# Patient Record
Sex: Female | Born: 1985 | Race: White | Hispanic: No | Marital: Married | Smoking: Never smoker
Health system: Southern US, Community
[De-identification: ages and names within clinical notes are randomized; demographics above are authoritative.]

## PROBLEM LIST (undated history)

## (undated) ENCOUNTER — Inpatient Hospital Stay (HOSPITAL_COMMUNITY): Payer: Self-pay

## (undated) DIAGNOSIS — K589 Irritable bowel syndrome without diarrhea: Secondary | ICD-10-CM

## (undated) HISTORY — PX: COLON BIOPSY: SHX1369

## (undated) HISTORY — PX: WISDOM TOOTH EXTRACTION: SHX21

## (undated) HISTORY — DX: Irritable bowel syndrome, unspecified: K58.9

## (undated) HISTORY — PX: OTHER SURGICAL HISTORY: SHX169

## (undated) HISTORY — PX: COLON SURGERY: SHX602

---

## 2010-03-16 ENCOUNTER — Emergency Department (HOSPITAL_COMMUNITY): Admission: EM | Admit: 2010-03-16 | Discharge: 2010-03-16 | Payer: Self-pay | Admitting: Emergency Medicine

## 2010-12-05 HISTORY — PX: OTHER SURGICAL HISTORY: SHX169

## 2011-02-23 LAB — POCT I-STAT, CHEM 8
Calcium, Ion: 1.11 mmol/L — ABNORMAL LOW (ref 1.12–1.32)
Creatinine, Ser: 0.8 mg/dL (ref 0.4–1.2)
Hemoglobin: 13.6 g/dL (ref 12.0–15.0)
TCO2: 25 mmol/L (ref 0–100)

## 2011-02-23 LAB — URINALYSIS, ROUTINE W REFLEX MICROSCOPIC
Bilirubin Urine: NEGATIVE
Ketones, ur: NEGATIVE mg/dL
Nitrite: NEGATIVE
Protein, ur: NEGATIVE mg/dL
Urobilinogen, UA: 0.2 mg/dL (ref 0.0–1.0)
pH: 5.5 (ref 5.0–8.0)

## 2011-02-23 LAB — DIFFERENTIAL
Basophils Absolute: 0 10*3/uL (ref 0.0–0.1)
Basophils Relative: 0 % (ref 0–1)
Monocytes Relative: 5 % (ref 3–12)

## 2011-02-23 LAB — URINE MICROSCOPIC-ADD ON

## 2011-02-23 LAB — CBC
HCT: 38.4 % (ref 36.0–46.0)
Hemoglobin: 13.3 g/dL (ref 12.0–15.0)
MCHC: 34.6 g/dL (ref 30.0–36.0)
MCV: 88.6 fL (ref 78.0–100.0)
WBC: 6.9 10*3/uL (ref 4.0–10.5)

## 2011-04-06 ENCOUNTER — Other Ambulatory Visit: Payer: Self-pay | Admitting: General Surgery

## 2011-04-08 ENCOUNTER — Ambulatory Visit
Admission: RE | Admit: 2011-04-08 | Discharge: 2011-04-08 | Disposition: A | Discharge status: Home / Self Care | Payer: BC Managed Care – PPO | Source: Ambulatory Visit | Attending: General Surgery | Admitting: General Surgery

## 2011-04-08 MED ORDER — IOHEXOL 300 MG/ML  SOLN
100.0000 mL | Freq: Once | INTRAMUSCULAR | Status: AC | PRN
  Administered 2011-04-08: 100 mL via INTRAVENOUS

## 2011-04-27 ENCOUNTER — Encounter (HOSPITAL_COMMUNITY)
Admission: RE | Admit: 2011-04-27 | Discharge: 2011-04-27 | Disposition: A | Discharge status: Home / Self Care | Payer: BC Managed Care – PPO | Source: Ambulatory Visit | Attending: General Surgery | Admitting: General Surgery

## 2011-04-27 LAB — CBC
MCHC: 34.1 g/dL (ref 30.0–36.0)
RDW: 11.5 % (ref 11.5–15.5)

## 2011-04-27 LAB — DIFFERENTIAL
Basophils Absolute: 0 10*3/uL (ref 0.0–0.1)
Basophils Relative: 0 % (ref 0–1)
Eosinophils Absolute: 0.3 10*3/uL (ref 0.0–0.7)
Monocytes Absolute: 0.4 10*3/uL (ref 0.1–1.0)
Monocytes Relative: 6 % (ref 3–12)
Neutro Abs: 3.4 10*3/uL (ref 1.7–7.7)
Neutrophils Relative %: 56 % (ref 43–77)

## 2011-04-27 LAB — COMPREHENSIVE METABOLIC PANEL
BUN: 4 mg/dL — ABNORMAL LOW (ref 6–23)
CO2: 25 mEq/L (ref 19–32)
Calcium: 9.7 mg/dL (ref 8.4–10.5)
Chloride: 103 mEq/L (ref 96–112)
Creatinine, Ser: 0.64 mg/dL (ref 0.4–1.2)
Glucose, Bld: 88 mg/dL (ref 70–99)
Potassium: 4.2 mEq/L (ref 3.5–5.1)
Total Bilirubin: 0.4 mg/dL (ref 0.3–1.2)
Total Protein: 6.9 g/dL (ref 6.0–8.3)

## 2011-04-27 LAB — URINALYSIS, ROUTINE W REFLEX MICROSCOPIC
Bilirubin Urine: NEGATIVE
Glucose, UA: NEGATIVE mg/dL
Hgb urine dipstick: NEGATIVE
Ketones, ur: NEGATIVE mg/dL
Nitrite: NEGATIVE
Specific Gravity, Urine: 1.016 (ref 1.005–1.030)
pH: 6 (ref 5.0–8.0)

## 2011-04-27 LAB — SURGICAL PCR SCREEN: Staphylococcus aureus: NEGATIVE

## 2011-04-27 LAB — URINE MICROSCOPIC-ADD ON

## 2011-05-04 ENCOUNTER — Inpatient Hospital Stay (HOSPITAL_COMMUNITY)
Admission: RE | Admit: 2011-05-04 | Discharge: 2011-05-12 | DRG: 149 | Disposition: A | Discharge status: Home Health | Payer: BC Managed Care – PPO | Source: Ambulatory Visit | Attending: General Surgery | Admitting: General Surgery

## 2011-05-04 ENCOUNTER — Other Ambulatory Visit (INDEPENDENT_AMBULATORY_CARE_PROVIDER_SITE_OTHER): Payer: Self-pay | Admitting: General Surgery

## 2011-05-04 DIAGNOSIS — K929 Disease of digestive system, unspecified: Principal | ICD-10-CM | POA: Diagnosis present

## 2011-05-04 DIAGNOSIS — R339 Retention of urine, unspecified: Secondary | ICD-10-CM | POA: Diagnosis not present

## 2011-05-04 DIAGNOSIS — IMO0002 Reserved for concepts with insufficient information to code with codable children: Secondary | ICD-10-CM | POA: Diagnosis not present

## 2011-05-04 DIAGNOSIS — Z01812 Encounter for preprocedural laboratory examination: Secondary | ICD-10-CM

## 2011-05-04 DIAGNOSIS — F411 Generalized anxiety disorder: Secondary | ICD-10-CM | POA: Diagnosis present

## 2011-05-04 DIAGNOSIS — Y832 Surgical operation with anastomosis, bypass or graft as the cause of abnormal reaction of the patient, or of later complication, without mention of misadventure at the time of the procedure: Secondary | ICD-10-CM | POA: Diagnosis present

## 2011-05-04 DIAGNOSIS — R7989 Other specified abnormal findings of blood chemistry: Secondary | ICD-10-CM | POA: Diagnosis not present

## 2011-05-04 LAB — TYPE AND SCREEN: Antibody Screen: NEGATIVE

## 2011-05-04 LAB — ABO/RH: ABO/RH(D): O POS

## 2011-05-05 LAB — CBC
HCT: 37.6 % (ref 36.0–46.0)
Hemoglobin: 13 g/dL (ref 12.0–15.0)
MCH: 29.7 pg (ref 26.0–34.0)
MCHC: 34.6 g/dL (ref 30.0–36.0)
MCV: 85.8 fL (ref 78.0–100.0)
Platelets: 164 K/uL (ref 150–400)
RBC: 4.38 MIL/uL (ref 3.87–5.11)
RDW: 11.4 % — ABNORMAL LOW (ref 11.5–15.5)
WBC: 9 K/uL (ref 4.0–10.5)

## 2011-05-05 LAB — BASIC METABOLIC PANEL WITH GFR
BUN: <3 mg/dL — ABNORMAL LOW (ref 6–23)
CO2: 26 meq/L (ref 19–32)
Calcium: 8.4 mg/dL (ref 8.4–10.5)
Chloride: 103 meq/L (ref 96–112)
Creatinine, Ser: <0.47 mg/dL (ref 0.4–1.2)
Glucose, Bld: 129 mg/dL — ABNORMAL HIGH (ref 70–99)
Potassium: 3.5 meq/L (ref 3.5–5.1)
Sodium: 135 meq/L (ref 135–145)

## 2011-05-09 LAB — COMPREHENSIVE METABOLIC PANEL
BUN: <3 mg/dL — ABNORMAL LOW (ref 6–23)
Calcium: 9.1 mg/dL (ref 8.4–10.5)
Chloride: 102 mEq/L (ref 96–112)
Creatinine, Ser: <0.47 mg/dL (ref 0.4–1.2)
Glucose, Bld: 113 mg/dL — ABNORMAL HIGH (ref 70–99)
Total Protein: 6.3 g/dL (ref 6.0–8.3)

## 2011-05-09 LAB — CBC
HCT: 35.9 % — ABNORMAL LOW (ref 36.0–46.0)
MCHC: 33.4 g/dL (ref 30.0–36.0)
MCV: 86.5 fL (ref 78.0–100.0)
RBC: 4.15 MIL/uL (ref 3.87–5.11)
RDW: 11.5 % (ref 11.5–15.5)
WBC: 5.1 10*3/uL (ref 4.0–10.5)

## 2011-05-10 ENCOUNTER — Inpatient Hospital Stay (HOSPITAL_COMMUNITY): Payer: BC Managed Care – PPO

## 2011-05-10 LAB — COMPREHENSIVE METABOLIC PANEL
AST: 156 U/L — ABNORMAL HIGH (ref 0–37)
CO2: 25 mEq/L (ref 19–32)
Chloride: 104 mEq/L (ref 96–112)
Creatinine, Ser: 0.52 mg/dL (ref 0.4–1.2)
GFR calc Af Amer: >60 mL/min (ref >60)
GFR calc non Af Amer: >60 mL/min (ref >60)
Glucose, Bld: 112 mg/dL — ABNORMAL HIGH (ref 70–99)
Total Bilirubin: 0.6 mg/dL (ref 0.3–1.2)

## 2011-05-10 NOTE — Op Note (Signed)
NAMESANDRINE, BLOODSWORTH               ACCOUNT NO.:  [id number]  MEDICAL RECORD NO.:  [id number]           PATIENT TYPE:  I  LOCATION:  5155                         FACILITY:  [facility]  PHYSICIAN:  Angelia Mould. Derrell Lolling, M.D.DATE OF BIRTH:  Apr 09, 1986  DATE OF PROCEDURE:  05/04/2011 DATE OF DISCHARGE:                              OPERATIVE REPORT   PREOPERATIVE DIAGNOSES:  Anastomotic stricture, status post ileocolic resection for intestinal atresia.  POSTOPERATIVE DIAGNOSES:  Anastomotic stricture, status post ileocolic resection for intestinal atresia.  OPERATIONS PERFORMED:  Exploratory laparotomy, lysis of adhesions, resection of terminal ileum, and hepatic flexure of colon with primary anastomosis.  SURGEON:  Angelia Mould. Derrell Lolling, MD  FIRST ASSISTANT:  Abigail Miyamoto, MD  OPERATIVE INDICATIONS:  This is a 25 year old healthy Caucasian female who was born with intestinal atresia and was taken to the operating room as a newborn by Dr. Orpah Melter.  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 She recovered and has been healthy.  She has had chronic diarrhea.  For the past 4 months, she has been having decreased stool volume to where she is to have several stools a day; she now just has about one liquid stool a day.  She feels crampy and bloated in that her clothes do not fit right, and she feels like she has trapped a gas.  A colonoscopy was performed in April 2012, and he found two small inflammatory polyps in the rectum, and then he found a very tight stricture of the ileocolic anastomosis, and the scope could not be passed through that.  He biopsied the stricture, which was read as "ulcer."  There was no granulomas identified.  No evidence of Crohn's.  Her father has Crohn disease.   We did a CT enterography, and this shows a short segment, annular, ileocolic anastomosis on the right side of the abdomen with focal thickening consistent with an anastomotic stricture.  That looks like there may be a blind pouch of right colon  up onto the liver as well, which also fills with the contrast.  She was offered exploration and resection of her stricture for control of her obstructive symptoms. She has undergone a bowel prep at home and is brought to the operating room electively.  OPERATIVE FINDINGS:  There was a concentric focal thickening at the ileocolic anastomosis on the right side.  There was a side pouch of right colon, which probably was the cecum, which was never resected. There was no sign of any cancer.  This did not look like Crohn's disease. We ran the intestinal tract from the ligament of Treitz all the way down to the rectum on about three occasions and found no other pathologic abnormalities, no masses, no strictures, no mesenteric thickening.  The stomach looked normal other than adhesions to the left upper quadrantwhere her gastrostomy tube was.  The liver and gallbladder looked normal.  The uterus and ovaries looked and felt normal.  OPERATIVE TECHNIQUE:  Following the induction of general endotracheal anesthesia, a Foley catheter was placed.  Intravenous antibiotics were given.  The abdomen was prepped and draped in a sterile fashion. Surgical time-out was held identifying correct patient and correct procedure.  A midline laparotomy incision was made slightly above and slightly below the umbilicus.  The fascia was incised in the midline and the abdominal cavity entered under direct vision.  There were minimal adhesions to the anterior abdominal wall.  We ran the small bowel and colon and identified all of the abnormalities.  I then mobilized the ileocolic anastomosis and the remaining right colon and the hepatic flexure all the way up around to the right  transverse colon.  I identified the duodenum and that was left uninjured.  We divided the gastrocolic omentum as part of this mobilization.  We decided to go ahead and resect this area conservatively.  We transected the distal ileum with a GIA stapling device, and we probably sacrificed about 2 or 3 inches of terminal ileum.  We took down the mesentery of the terminal ileum and the right colon and hepatic flexure.  Some of the larger vessels were clamped, divided, and ligated with a 2-0 silk ties and some of the smaller ones were controlled with the LigaSure device.  The right transverse colon was transected with GIA stapling device about 4 inches distal to the old anastomosis.  The specimen was sent to the lab with the appropriate history attached.  We irrigated the abdomen and pelvis extensively.  There was no bleeding.  We then created an anastomosis between the terminal ileum at the right transverse colon with a GIA stapling device.  The common defect in the bowel wall was inspected, found to be hemostatic, and then closed with a TA-60 stapling device. We placed further silk sutures to reinforce the staple line at critical points.  We then changed our instruments and gloves.  We irrigated the abdomen and pelvis extensively.  There was no more bleeding from any of the staple lines or suture lines.  The mesentery was closed with interrupted sutures of 3-0 silk.  Everything looked good.  The anastomosis was checked and tested one more time.  The bowel was viable and had a good blood supply.  We returned the small bowel and colon and omentum to their anatomic positions.  The midline fascia was closed with a running suture of #1 double-stranded PDS.  The skin was closed with skin staples.  Clean bandages were placed, and the patient was taken to recovery room in stable condition.  Estimated blood loss was about 50-75 mL.  Complications none.  Sponge, needle, and instrument counts  were correct.     Angelia Mould. Derrell Lolling, M.D.     HMI/MEDQ  D:  05/04/2011  T:  05/05/2011  Job:  [id number]  cc:   Fayrene Fearing L. Malon Kindle., M.D. Carola J. Gerri Spore, M.D.  Electronically Signed by Claud Kelp M.D. on 05/10/2011 09:11:05 AM

## 2011-05-11 LAB — HEPATIC FUNCTION PANEL
Alkaline Phosphatase: 65 U/L (ref 39–117)
Indirect Bilirubin: 0.2 mg/dL — ABNORMAL LOW (ref 0.3–0.9)
Total Bilirubin: 0.3 mg/dL (ref 0.3–1.2)
Total Protein: 6 g/dL (ref 6.0–8.3)

## 2011-05-23 ENCOUNTER — Encounter (INDEPENDENT_AMBULATORY_CARE_PROVIDER_SITE_OTHER): Payer: Self-pay | Admitting: General Surgery

## 2011-05-23 NOTE — Discharge Summary (Signed)
NAMECARLON, DAVIDSON NO.:  [id number]  MEDICAL RECORD NO.:  [id number]  LOCATION:  5155                         FACILITY:  [facility]  PHYSICIAN:  Angelia Mould. Derrell Lolling, M.D.DATE OF BIRTH:  10-20-1986  DATE OF ADMISSION:  05/04/2011 DATE OF DISCHARGE:  05/12/2011                              DISCHARGE SUMMARY   FINAL DIAGNOSES: 1. Ileocolic anastomotic stricture with partial obstruction. 2. Remote history of ileocolic resection and anastomosis for     intestinal atresia as newborn.  OPERATIONS PERFORMED:  Exploratory laparotomy, resection of terminal ileum and hepatic flexure of colon with primary anastomosis.  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  The operative note describes a malrotation and trimming of both ends of the intestine and anastomosis between the terminal ileum and right colon.  She has been healthy since that time, although has had chronic diarrhea.  She has been followed by Dr. Carman Ching.  Her father has Crohn's disease.  She has developed crampy abdominal pain and reduced stool volume, although really has not been vomiting or lost any weight.  A colonoscopy was performed showing a very tight stricture of the ileocolic anastomosis, through which the scope could not be passed.  Biopsy of the stricture was read as benign ulcer. No granulomas or Crohn disease was identified.  CT enterography also showed a short segment of concentric thickening at the present anastomosis.  It was felt the patient was developing obstruction from a stricture at the anastomosis and was offered resection.  Her diarrhea had been suspected as being due to some superimposed irritable bowel syndrome by her gastroenterologist.  HOSPITAL COURSE:  On day of admission, the patient was taken to operating room, underwent laparotomy.  We found a concentric  focal thickening of the ileocolic anastomosis on the right side as well as side pouch of right colon which was probably the cecum which was never resected.  All this area was resected conservatively and stapled anastomosis between the distal ileum and the hepatic flexure was performed and that went well.  The final pathology report showed ulcerated and inflamed enteric mucosa with reactive epithelial changes consistent with an anastomotic site. There was no evidence of malignancy or Crohn disease.  Postoperatively, the patient did fairly well.  She was slow to resolve her ileus, but ultimately it did resolve.  She had some anxiety and nightmares in the hospital which resolved with adjusting her pain medication.  We discussed her pathology report with her.  We slowly tapered her off of narcotics.  She had an episode of nausea and vomiting on June 4 and we chose to get a gallbladder ultrasound which was normal and abdominal x-rays which were abnormal but liver function tests were performed which showed some elevation of SGOT and SGPT.  This had been present on admission but was actually a little bit more elevated at this point in time.  We asked Dr. Dorena Cookey to see her.  He felt that the liver function test could be followed up as an outpatient but the etiology was not entirely clear.  She improved  and we were able to advance her to a solid diet.  She began having bowel movements.  Her abdominal wounds were healing uneventfully.  She was discharged on May 12, 2011.  Final liver function tests showed an SGOT of 162 and SGPT of 208.  The rest of the liver function tests were normal.  She was instructed in diet and activities and was told to return to see me in office in 3-4 weeks.  She was told to follow up with the [facility] as an outpatient to test her liver function test further in the future.  Serologic evaluation was felt to be warranted at siome point unless the liver function  tests normalized.  DISCHARGE MEDICATIONS: 1. Xanax 1 mg 3 times a day as needed. 2. Multivitamins. 3. She also takes Junel Fe.     Angelia Mould. Derrell Lolling, M.D.     HMI/MEDQ  D:  05/22/2011  T:  05/22/2011  Job:  [id number]  cc:   Fayrene Fearing L. Malon Kindle., M.D. Carola J. Gerri Spore, M.D.  Electronically Signed by Claud Kelp M.D. on 05/23/2011 05:18:29 PM

## 2011-06-21 ENCOUNTER — Encounter (INDEPENDENT_AMBULATORY_CARE_PROVIDER_SITE_OTHER): Payer: Self-pay | Admitting: General Surgery

## 2011-06-21 ENCOUNTER — Ambulatory Visit (INDEPENDENT_AMBULATORY_CARE_PROVIDER_SITE_OTHER): Payer: BC Managed Care – PPO | Admitting: General Surgery

## 2011-06-21 DIAGNOSIS — Z9889 Other specified postprocedural states: Secondary | ICD-10-CM

## 2011-06-21 NOTE — Patient Instructions (Signed)
You are doing well, and you may resume all normal physical activities without restriction. You may return to work.  In my opinion, it is safe to get pregnant. I would seek the advice of a obstetrician before getting pregnant, however.  Remember that you have lost your terminal ileum and so there may be some problems with vitamin deficiency and anemia in the future. Be sure that your other physicians are aware of this.  In terms of your wound, simply packed it loosely with 1/4 inch gauze twice a day for about one week. After that time simply cover it with a Band-Aid. I expect it will heal completely in 3-4 weeks.  Return to see me p.r.n.

## 2011-06-21 NOTE — Progress Notes (Signed)
Subjective:     Patient ID: Abigail Park, female   DOB: Dec 26, 1985, 25 y.o.   MRN: [id number]  HPI The patient is doing well. She has no pain. She feels ready to return to work. Her appetite is much better. She's having one or 2 bowel movements per day, sometimes loose and sometimes soft. Her wound is healing and rapidly.Review of Systems Past Medical History  Diagnosis Date  . Allergy   . IBS (irritable bowel syndrome)   . Ulcer   . Chronic kidney disease     kidney stones   Current outpatient prescriptions:Multiple Vitamin (MULTIVITAMIN) capsule, Take 1 capsule by mouth daily.  , Disp: , Rfl: ;  norethindrone-ethinyl estradiol (JUNEL FE 1/20) 1-20 MG-MCG per tablet, Take 1 tablet by mouth daily.  , Disp: , Rfl: ;  ALPRAZolam (XANAX) 1 MG tablet, Take 1 mg by mouth as needed.  , Disp: , Rfl:  Allergies  Allergen Reactions  . Motrin (Ibuprofen) Hives   History reviewed. No pertinent family history. History  Substance Use Topics  . Smoking status: Never Smoker   . Smokeless tobacco: Never Used  . Alcohol Use: Yes      Objective:   Physical Exam The patient looks well. Her mother is with her throughout the encounter.  Abdomen is soft flat and nontender. There is a small open area in the midportion of the wound, no more than 1 cm in size. There is healthy granulation tissue in the wound. It was repacked loosely with iodoform gauze.    Assessment:     Status post exploratory laparotomy and resection of ileocolic anastomotic stricture. Recovering uneventfully.  Minor wound infection, healing rapidly by secondary intention.  History of intestinal atresia of the distal ileum at birth with perforation and peritonitis, status post resection and ileocolic anastomosis by Dr. Orpah Melter.    Plan:     Wound care is discussed. Continue packing b.i.d. For one week. Thereafter simply cover with band aid nd expect complete healing in 3-4 weeks.  Okay to resume all normal activities  without restriction.  See me PRN.

## 2011-06-22 NOTE — Consult Note (Signed)
  NAME:  CLARITY, CISZEK               ACCOUNT NO.:  [id number]  MEDICAL RECORD NO.:  [id number]  LOCATION:  SDS                          FACILITY:  [facility]  PHYSICIAN:  Syncere Eble C. Madilyn Fireman, M.D.    DATE OF BIRTH:  [birth year]  DATE OF CONSULTATION: DATE OF DISCHARGE:  04/27/2011                                CONSULTATION   REASON FOR CONSULTATION:  Elevated liver function tests and abdominal pain, nausea, and vomiting.  HISTORY OF PRESENT ILLNESS:  The patient is a 25 year old white female born with intestinal atresia who underwent surgical resection at birth with enterocolic anastomosis.  This has become stenotic and inflamed causing obstructive symptoms.  She underwent a resection of it on May 31.  Since then she began having some nausea and vomiting that she relates to having been switched from IV pain medicine to oral Vicodin. This has caused nausea and vomiting and she is also complained of generalized abdominal pain.  She also was noted to have elevated liver function tests with ALT of 88 on May 23, 93 on June 4, and 180 on June 5, with an AST of 156.  She has had no known liver problems.  An abdominal ultrasound was obtained today which showed no hepatobiliary abnormalities, except for some echogenic sludge.  Plain abdominal films today showed nonspecific bowel gas pattern.  The patient does complain of big anxiety component to her symptoms.  PAST MEDICAL HISTORY:  Essentially unremarkable other than the above except for generalized anxiety.  MEDICINES:  Currently 1. Protonix, p.r.n. 2. Toradol. 3. Ativan 0.25 mg IV p.r.n. 4. Zofran just added for pain.  ALLERGIES:  IBUPROFEN.  FAMILY HISTORY:  Father has Crohn disease.  PHYSICAL EXAM:  GENERAL:  Well-developed, well-nourished white female in no acute distress. HEART:  Regular rate and rhythm without murmurs. LUNGS:  Clear. ABDOMEN:  Soft, nondistended with normoactive bowel sounds.  Tenderness is expected near the suture  line.  IMPRESSION:  Abdominal pain, nausea and vomiting after abdominal surgery, unclear whether this is simply from an ileus or related to narcotic analgesics for her postoperative pain and there is probably an anxiety component.  PLAN:  Incurs retake Zofran she has not been asking for nausea medicine. I will also increase her Ativan dose slightly to take p.r.n. for anxiety and simply advance diet as tolerated, observe her symptoms going forward.  We will recheck liver function tests tomorrow and will probably need some sort of serologic workup for her elevations unless they resolve spontaneously.          ______________________________ Everardo All Madilyn Fireman, M.D.     JCH/MEDQ  D:  05/10/2011  T:  05/10/2011  Job:  [id number]  cc:   Angelia Mould. Derrell Lolling, M.D.  Electronically Signed by Dorena Cookey M.D. on 06/22/2011 06:51:23 PM

## 2011-09-14 ENCOUNTER — Other Ambulatory Visit (HOSPITAL_COMMUNITY)
Admission: RE | Admit: 2011-09-14 | Discharge: 2011-09-14 | Disposition: A | Discharge status: Home / Self Care | Payer: BC Managed Care – PPO | Source: Ambulatory Visit | Attending: Family Medicine | Admitting: Family Medicine

## 2011-09-14 ENCOUNTER — Other Ambulatory Visit: Payer: Self-pay | Admitting: Family Medicine

## 2011-09-14 DIAGNOSIS — Z124 Encounter for screening for malignant neoplasm of cervix: Secondary | ICD-10-CM | POA: Insufficient documentation

## 2012-09-07 IMAGING — CT CT ENTEROGRAPHY (ABD-PELV W/ CM)
2 of 5 series · 12 of 36 positions shown, 19 images · IV contrast (VOLUMEN & [ID] OMNI 300)
Comparison: [HOSPITAL] CT urogram 03/16/2010.

CLINICAL DATA: Incomplete colonoscopy due to atresia.  Prior colon
surgery as infant.  Diarrhea, melena, history renal calculi.

CT ABDOMEN AND PELVIS WITH CONTRAST (CT ENTEROGRAPHY)
TECHNIQUE: Multidetector CT of the abdomen and pelvis during bolus
administration of intravenous contrast. Negative oral contrast
VoLumen was given.
Contrast: 100 ml Lmnipaque-ZKK intravenously and 3596 ml VoLumen
orally.

[Series 102: enterography (id) · axial · 0.70mm/px · z∈[-302,+38]mm · 11 of 164 slices shown, 17 images]
[im 14/164  soft-tissue]
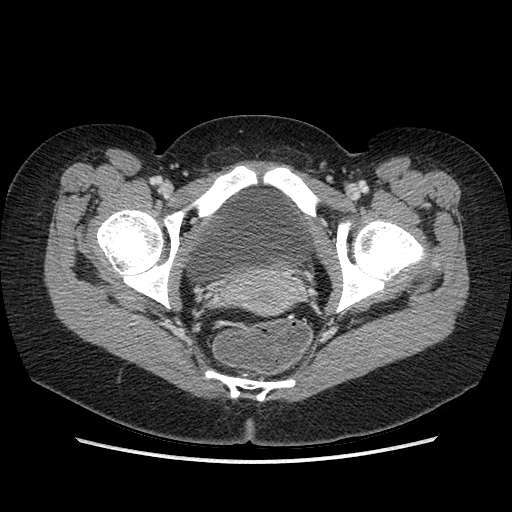
[im 14/164  bone]
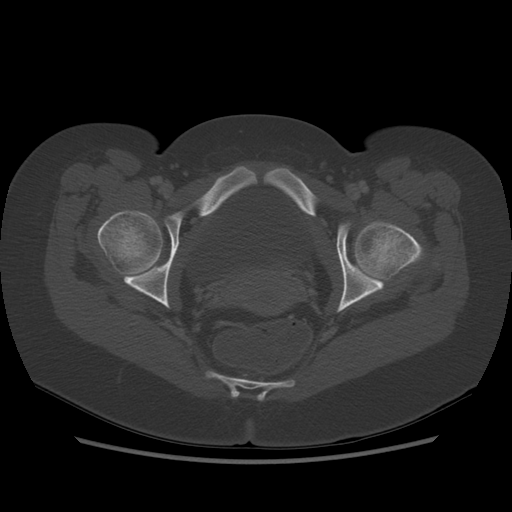
[im 28/164  soft-tissue]
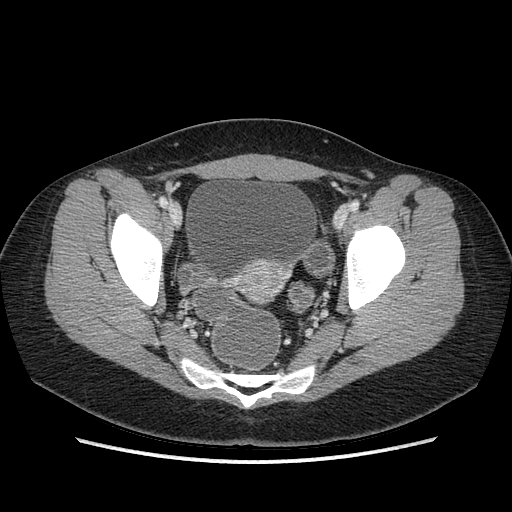
[im 41/164  soft-tissue]
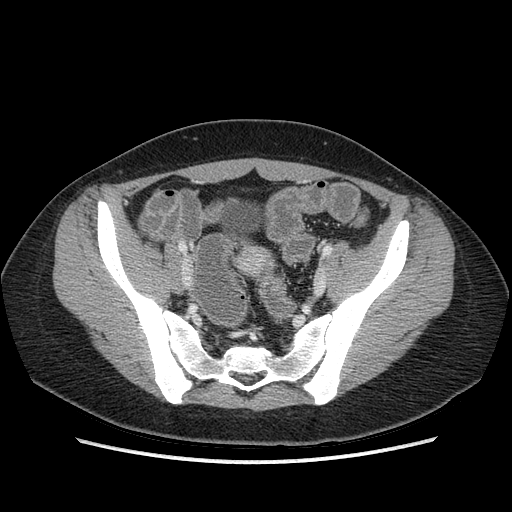
[im 55/164  soft-tissue]
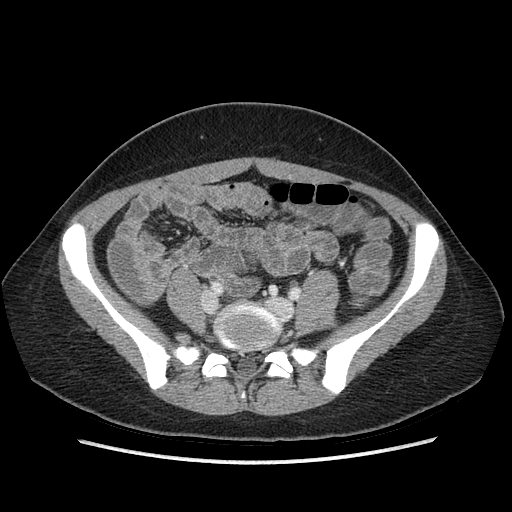
[im 68/164  soft-tissue]
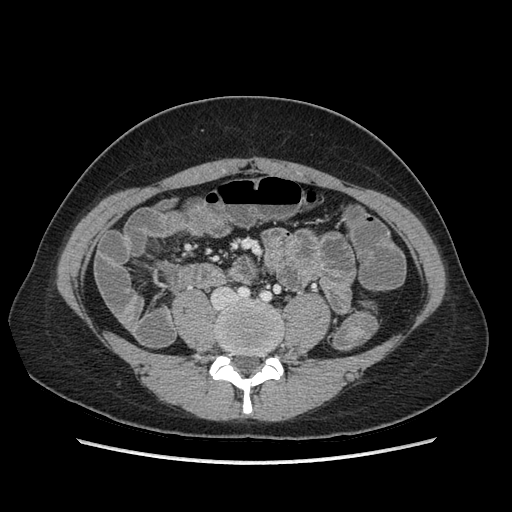
[im 82/164  soft-tissue]
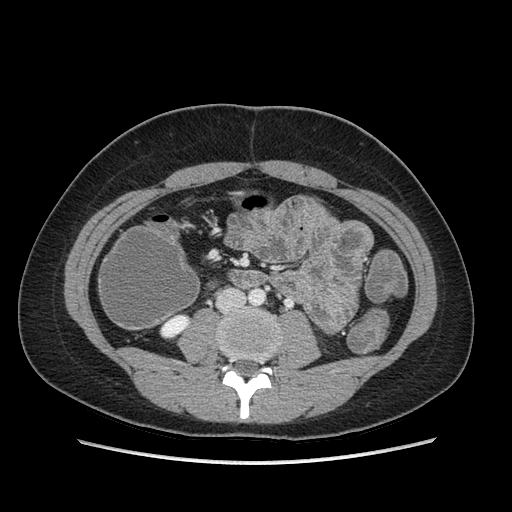
[im 96/164  soft-tissue]
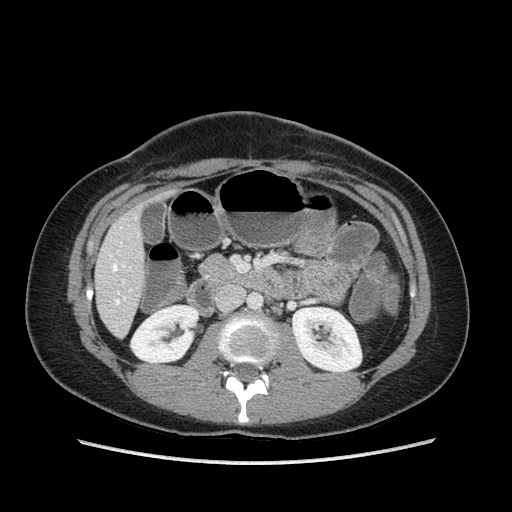
[im 109/164  soft-tissue]
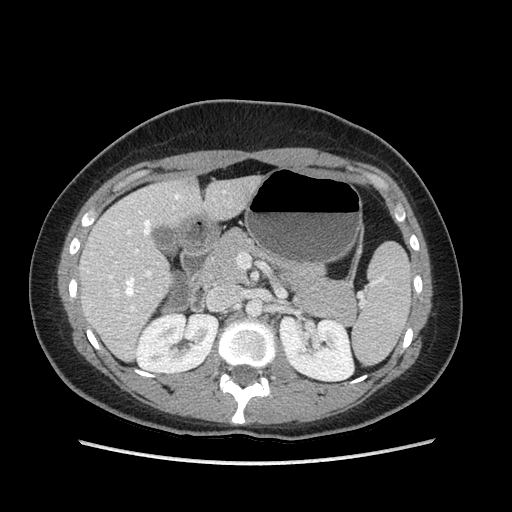
[im 109/164  lung]
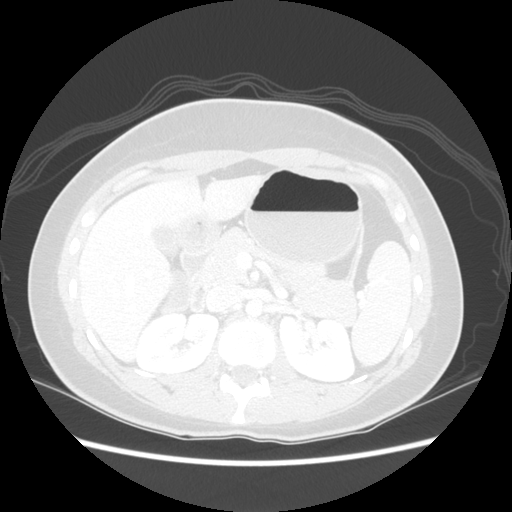
[im 123/164  soft-tissue]
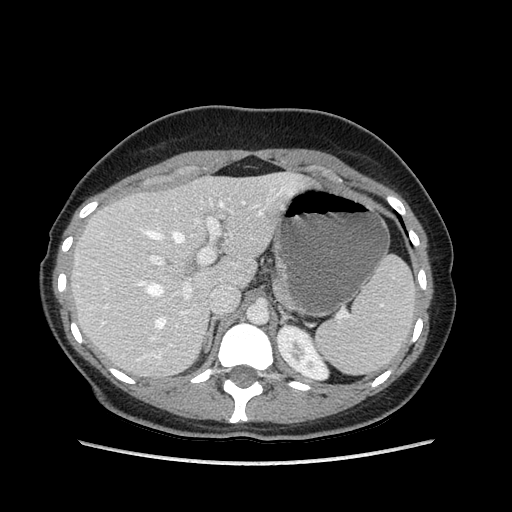
[im 123/164  lung]
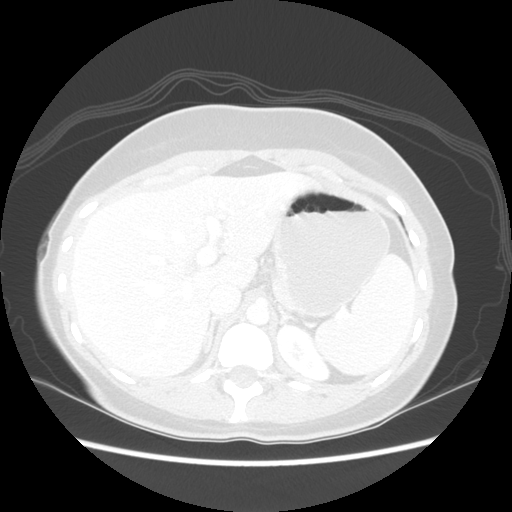
[im 123/164  bone]
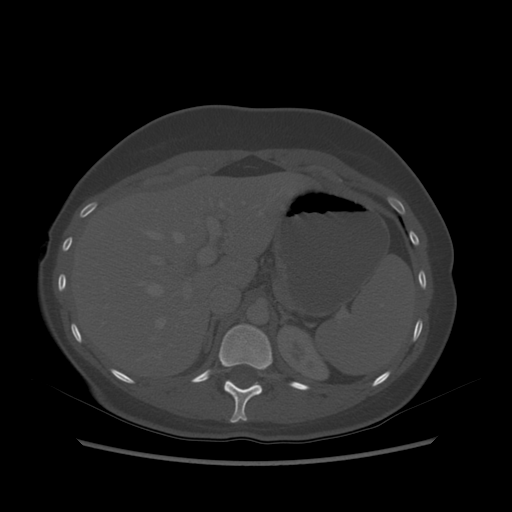
[im 136/164  soft-tissue]
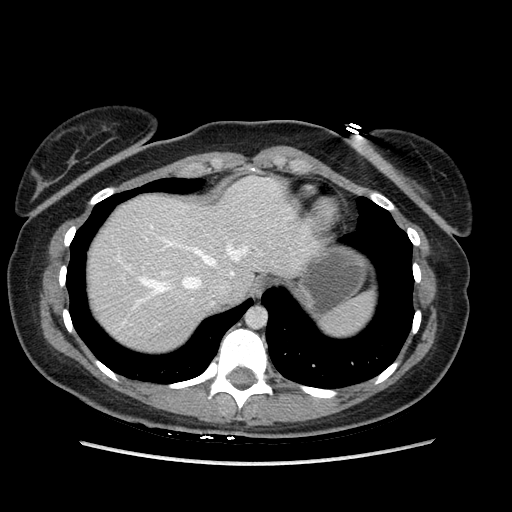
[im 136/164  lung]
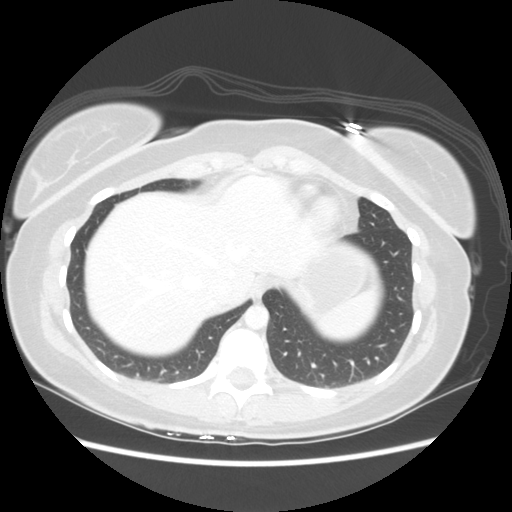
[im 150/164  soft-tissue]
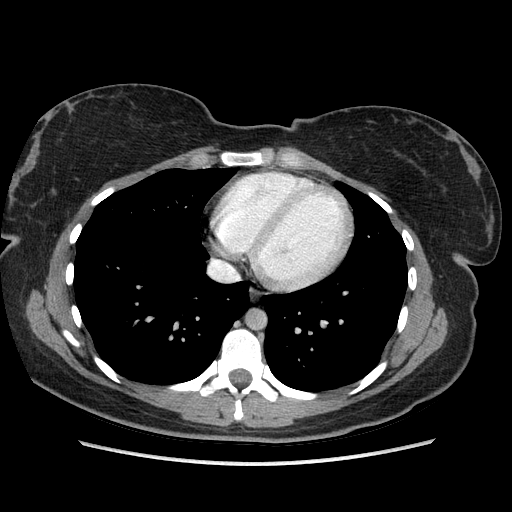
[im 150/164  lung]
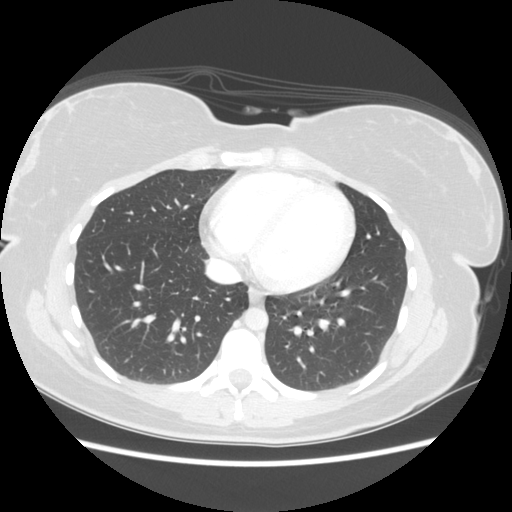

[Series 601: coronal body · coronal · 0.91mm/px · 1 of 117 slices shown, 2 images]
[im 39/117  soft-tissue]
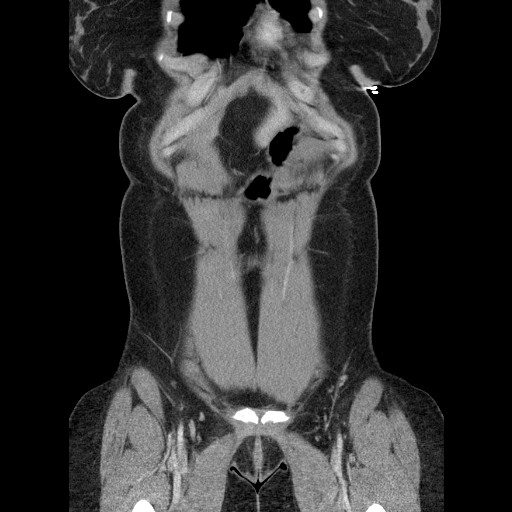
[im 39/117  bone]
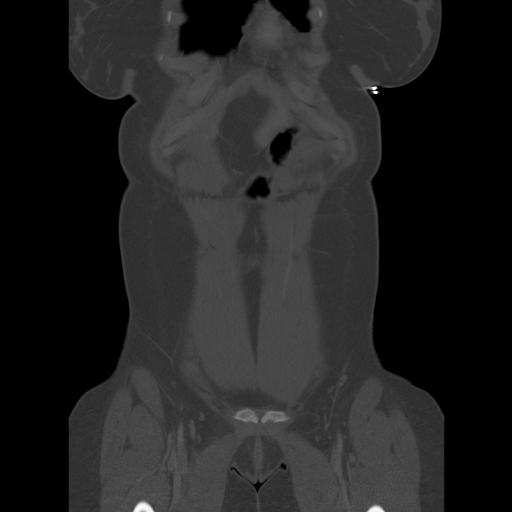

[12 of 36 positions shown; findings below may reference images not displayed]

FINDINGS: Stomach, duodenum, small bowel loops appear normal -
specifically no thymic toward bowel disease findings.

With aid of retrospection and current contrast study, no change in
axial image 45/3) nonobstructing annular narrowing consistent with
ileocolic anastomosis at ascending colon of known infantile surgery
for atresia. No mural enhancement or mesenteric inflammation seen
although 9 mm wall thickness seen at this level.

Slight increase mural density is likely due to collapsed rectum
without additional inflammatory findings.

Currently no urinary tract obstruction/calculus visualized.

Lung bases clear.

Osseous structures appear normal.

Remaining abdominal pelvic organs appear normal for age with no
inflammation, free fluid or adenopathy. (Increased number small
root of mesentery subcentimeter short axis lymph nodes).
IMPRESSION: 1. Unchanged, nonobstructing 17 mm short segment annular ileocolic
anastomoses/ascending colon with 9 mm wall thickness - no
additional inflammatory change.  Findings consistent with known
intestinal atresia and remote childhood surgery.
2.  Slight increased mural density likely due to collapsed rectum
without additional inflammatory bowel disease or intestinal
obstructive findings.
3.  Otherwise, normal.

## 2013-04-11 LAB — OB RESULTS CONSOLE ANTIBODY SCREEN: Antibody Screen: NEGATIVE

## 2013-04-11 LAB — OB RESULTS CONSOLE GC/CHLAMYDIA
Chlamydia: NEGATIVE
Gonorrhea: NEGATIVE

## 2013-04-11 LAB — OB RESULTS CONSOLE RUBELLA ANTIBODY, IGM: Rubella: IMMUNE

## 2013-07-02 ENCOUNTER — Encounter (HOSPITAL_COMMUNITY): Payer: Self-pay

## 2013-07-02 ENCOUNTER — Inpatient Hospital Stay (HOSPITAL_COMMUNITY)
Admission: AD | Admit: 2013-07-02 | Discharge: 2013-07-02 | Disposition: A | Discharge status: Home / Self Care | Payer: BC Managed Care – PPO | Source: Ambulatory Visit | Attending: Obstetrics and Gynecology | Admitting: Obstetrics and Gynecology

## 2013-07-02 DIAGNOSIS — R1013 Epigastric pain: Secondary | ICD-10-CM | POA: Insufficient documentation

## 2013-07-02 DIAGNOSIS — O99891 Other specified diseases and conditions complicating pregnancy: Secondary | ICD-10-CM | POA: Insufficient documentation

## 2013-07-02 DIAGNOSIS — M545 Low back pain, unspecified: Secondary | ICD-10-CM | POA: Insufficient documentation

## 2013-07-02 DIAGNOSIS — O210 Mild hyperemesis gravidarum: Secondary | ICD-10-CM | POA: Insufficient documentation

## 2013-07-02 LAB — COMPREHENSIVE METABOLIC PANEL
AST: 11 U/L (ref 0–37)
Albumin: 2.7 g/dL — ABNORMAL LOW (ref 3.5–5.2)
Alkaline Phosphatase: 48 U/L (ref 39–117)
Chloride: 102 mEq/L (ref 96–112)
Creatinine, Ser: 0.5 mg/dL (ref 0.50–1.10)
Potassium: 3.9 mEq/L (ref 3.5–5.1)
Total Bilirubin: 0.3 mg/dL (ref 0.3–1.2)

## 2013-07-02 LAB — CBC WITH DIFFERENTIAL/PLATELET
Basophils Absolute: 0 10*3/uL (ref 0.0–0.1)
Basophils Relative: 0 % (ref 0–1)
MCHC: 34.2 g/dL (ref 30.0–36.0)
Neutro Abs: 8.3 10*3/uL — ABNORMAL HIGH (ref 1.7–7.7)
Neutrophils Relative %: 77 % (ref 43–77)
Platelets: 196 10*3/uL (ref 150–400)
RDW: 12.3 % (ref 11.5–15.5)

## 2013-07-02 LAB — URINALYSIS, ROUTINE W REFLEX MICROSCOPIC
Ketones, ur: NEGATIVE mg/dL
Leukocytes, UA: NEGATIVE
Nitrite: NEGATIVE
Protein, ur: NEGATIVE mg/dL
pH: 6 (ref 5.0–8.0)

## 2013-07-02 LAB — AMYLASE: Amylase: 51 U/L (ref 0–105)

## 2013-07-02 MED ORDER — ACETAMINOPHEN-CODEINE #3 300-30 MG PO TABS: 1.0000 | ORAL_TABLET | Freq: Four times a day (QID) | ORAL | Status: DC | PRN

## 2013-07-02 MED ORDER — ONDANSETRON 4 MG PO TBDP
4.0000 mg | ORAL_TABLET | Freq: Once | ORAL | Status: AC
  Administered 2013-07-02: 4 mg via ORAL
  Filled 2013-07-02: qty 1

## 2013-07-02 MED ORDER — PANTOPRAZOLE SODIUM 40 MG PO TBEC
40.0000 mg | DELAYED_RELEASE_TABLET | Freq: Once | ORAL | Status: AC
  Administered 2013-07-02: 40 mg via ORAL
  Filled 2013-07-02: qty 1

## 2013-07-02 MED ORDER — GI COCKTAIL ~~LOC~~
30.0000 mL | Freq: Once | ORAL | Status: AC
  Administered 2013-07-02: 30 mL via ORAL
  Filled 2013-07-02: qty 30

## 2013-07-02 NOTE — MAU Provider Note (Signed)
History   27yo G1P0 at [redacted]w[redacted]d presents to MAU with c/o upper epigastric abdominal pain with vomiting since yesterday morning. Pt also reports some lower abdominal cramping and lower back pain she has been experiencing throughout the pregnancy.  Denies VB, UCs, LOF, recent fever, resp or GI c/o's, UTI or PIH s/s.   Chief Complaint  Patient presents with  . Abdominal Pain  . Emesis    OB History   Grav Para Term Preterm Abortions TAB SAB Ect Mult Living   1               Past Medical History  Diagnosis Date  . Allergy   . IBS (irritable bowel syndrome)   . Ulcer   . Chronic kidney disease     kidney stones    Past Surgical History  Procedure Laterality Date  . Colon surgery      2x  . Wisdom tooth extraction      History reviewed. No pertinent family history.  History  Substance Use Topics  . Smoking status: Never Smoker   . Smokeless tobacco: Never Used  . Alcohol Use: Yes    Allergies:  Allergies  Allergen Reactions  . Motrin (Ibuprofen) Hives    Prescriptions prior to admission  Medication Sig Dispense Refill  . FLUoxetine (PROZAC) 20 MG capsule Take 20 mg by mouth daily.      Marland Kitchen FOLIC ACID PO Take 1 tablet by mouth daily.      . Prenatal Vit-Fe Fumarate-FA (PRENATAL MULTIVITAMIN) TABS Take 1 tablet by mouth daily at 12 noon.      Marland Kitchen ALPRAZolam (XANAX) 1 MG tablet Take 0.5-1 mg by mouth as needed for anxiety.         ROS: see HPI above, all other systems are negative   Physical Exam   Blood pressure 117/67, pulse 81, resp. rate 16, height 5' 2.5" (1.588 m), weight 150 lb (68.04 kg), SpO2 100.00%.  Chest: Clear Heart: RRR Abdomen: gravid, NT Extremities: WNL Abdomen: soft, gravid, guarding in upper epigastric area  FHT: Doppler 148 UCs: none  Results for orders placed during the hospital encounter of 07/02/13 (from the past 24 hour(s))  URINALYSIS, ROUTINE W REFLEX MICROSCOPIC     Status: None   Collection Time    07/02/13 12:00 PM      Result  Value Range   Color, Urine YELLOW  YELLOW   APPearance CLEAR  CLEAR   Specific Gravity, Urine 1.025  1.005 - 1.030   pH 6.0  5.0 - 8.0   Glucose, UA NEGATIVE  NEGATIVE mg/dL   Hgb urine dipstick NEGATIVE  NEGATIVE   Bilirubin Urine NEGATIVE  NEGATIVE   Ketones, ur NEGATIVE  NEGATIVE mg/dL   Protein, ur NEGATIVE  NEGATIVE mg/dL   Urobilinogen, UA 0.2  0.0 - 1.0 mg/dL   Nitrite NEGATIVE  NEGATIVE   Leukocytes, UA NEGATIVE  NEGATIVE  CBC WITH DIFFERENTIAL     Status: Abnormal   Collection Time    07/02/13  2:30 PM      Result Value Range   WBC 10.7 (*) 4.0 - 10.5 K/uL   RBC 3.96  3.87 - 5.11 MIL/uL   Hemoglobin 11.9 (*) 12.0 - 15.0 g/dL   HCT 24.4 (*) 01.0 - 27.2 %   MCV 87.9  78.0 - 100.0 fL   MCH 30.1  26.0 - 34.0 pg   MCHC 34.2  30.0 - 36.0 g/dL   RDW 53.6  64.4 - 03.4 %  Platelets 196  150 - 400 K/uL   Neutrophils Relative % 77  43 - 77 %   Neutro Abs 8.3 (*) 1.7 - 7.7 K/uL   Lymphocytes Relative 18  12 - 46 %   Lymphs Abs 1.9  0.7 - 4.0 K/uL   Monocytes Relative 4  3 - 12 %   Monocytes Absolute 0.4  0.1 - 1.0 K/uL   Eosinophils Relative 1  0 - 5 %   Eosinophils Absolute 0.1  0.0 - 0.7 K/uL   Basophils Relative 0  0 - 1 %   Basophils Absolute 0.0  0.0 - 0.1 K/uL  COMPREHENSIVE METABOLIC PANEL     Status: Abnormal   Collection Time    07/02/13  2:30 PM      Result Value Range   Sodium 134 (*) 135 - 145 mEq/L   Potassium 3.9  3.5 - 5.1 mEq/L   Chloride 102  96 - 112 mEq/L   CO2 27  19 - 32 mEq/L   Glucose, Bld 103 (*) 70 - 99 mg/dL   BUN 4 (*) 6 - 23 mg/dL   Creatinine, Ser 0.27  0.50 - 1.10 mg/dL   Calcium 9.1  8.4 - 25.3 mg/dL   Total Protein 6.0  6.0 - 8.3 g/dL   Albumin 2.7 (*) 3.5 - 5.2 g/dL   AST 11  0 - 37 U/L   ALT 8  0 - 35 U/L   Alkaline Phosphatase 48  39 - 117 U/L   Total Bilirubin 0.3  0.3 - 1.2 mg/dL   GFR calc non Af Amer >90  >90 mL/min   GFR calc Af Amer >90  >90 mL/min  LIPASE, BLOOD     Status: None   Collection Time    07/02/13  2:30 PM       Result Value Range   Lipase 16  11 - 59 U/L  AMYLASE     Status: None   Collection Time    07/02/13  2:30 PM      Result Value Range   Amylase 51  0 - 105 U/L    ED Course  IUP at [redacted]w[redacted]d Abdominal pain and Nausea  Zofran ODT 4mg  Protonix 40 mg  C/w Dr. Normand Sloop  UA Amylase and lipase CMET CBC with Diff  D/c home with precautions F/u at next ROB on 7/31 Protonix 40mg  QD called to pharmacy   Haroldine Laws CNM, MSN 07/02/2013 3:07 PM

## 2013-07-02 NOTE — MAU Note (Signed)
Patient states she started having upper abdominal pain with vomiting yesterday. Some lower abdominal cramping, no bleeding.

## 2013-11-08 ENCOUNTER — Inpatient Hospital Stay (HOSPITAL_COMMUNITY)
Admission: AD | Admit: 2013-11-08 | Discharge: 2013-11-08 | Disposition: A | Discharge status: Home / Self Care | Payer: BC Managed Care – PPO | Source: Ambulatory Visit | Attending: Obstetrics and Gynecology | Admitting: Obstetrics and Gynecology

## 2013-11-08 ENCOUNTER — Encounter (HOSPITAL_COMMUNITY): Payer: Self-pay | Admitting: *Deleted

## 2013-11-08 DIAGNOSIS — O479 False labor, unspecified: Secondary | ICD-10-CM | POA: Insufficient documentation

## 2013-11-08 NOTE — MAU Provider Note (Signed)
  History     CSN: [id number]  Arrival date and time: 11/08/13 1020   First Provider Initiated Contact with Patient 11/08/13 1119      Chief Complaint  Patient presents with  . Labor Eval   HPI Comments: Pt is a G1P0 at 37wks here for labor check. Denies VB or LOF, GFM.        Past Medical History  Diagnosis Date  . Allergy   . IBS (irritable bowel syndrome)   . Ulcer   . Chronic kidney disease     kidney stones  . Infection     UTI  . Anxiety     on meds- helps    Past Surgical History  Procedure Laterality Date  . Colon surgery      2x  . Wisdom tooth extraction      Family History  Problem Relation Age of Onset  . Asthma Mother   . Hypertension Mother   . Hearing loss Mother     brain tumor  . Crohn's disease Father     History  Substance Use Topics  . Smoking status: Never Smoker   . Smokeless tobacco: Never Used  . Alcohol Use: Yes     Comment: occ, not with preg    Allergies:  Allergies  Allergen Reactions  . Motrin [Ibuprofen] Hives    Prescriptions prior to admission  Medication Sig Dispense Refill  . acetaminophen (TYLENOL) 500 MG tablet Take 1,000 mg by mouth every 6 (six) hours as needed for mild pain.      Marland Kitchen FLUoxetine (PROZAC) 20 MG capsule Take 20 mg by mouth daily.      Marland Kitchen FOLIC ACID PO Take 1 tablet by mouth daily.      . Prenatal Vit-Fe Fumarate-FA (PRENATAL MULTIVITAMIN) TABS Take 1 tablet by mouth daily at 12 noon.        Review of Systems  All other systems reviewed and are negative.   Physical Exam   Blood pressure 115/63, pulse 61, temperature 98 F (36.7 C), temperature source Oral, resp. rate 16.  Physical Exam  Nursing note and vitals reviewed. Constitutional: She is oriented to person, place, and time. She appears well-developed and well-nourished.  HENT:  Head: Normocephalic.  Eyes: Pupils are equal, round, and reactive to light.  Neck: Normal range of motion.  Cardiovascular: Normal rate.   Respiratory:  Effort normal.  GI: Soft.  Genitourinary: Vagina normal.  Musculoskeletal: Normal range of motion.  Neurological: She is alert and oriented to person, place, and time. She has normal reflexes.  Skin: Skin is warm and dry.  Psychiatric: She has a normal mood and affect. Her behavior is normal.    MAU Course  Procedures   Assessment and Plan  IUP at [redacted]w[redacted]d Not in labor, cervix 2/th/-2  FHR cat 1 toco rare  Discharge home, [facility] and labor sx's F/u as scheduled   Ammar Moffatt M 11/08/2013, 11:24 AM

## 2013-11-08 NOTE — MAU Note (Signed)
Contractions were stronger and closer last night.  Took a shower and things slowed down. Was able to doze during the night.  Has been contracting this morning, ~q11min.  Large amt of bloody streaked mucous this morning. Was 2+/50 last wk.

## 2013-12-03 ENCOUNTER — Encounter (HOSPITAL_COMMUNITY): Payer: Self-pay | Admitting: *Deleted

## 2013-12-03 ENCOUNTER — Telehealth (HOSPITAL_COMMUNITY): Payer: Self-pay | Admitting: *Deleted

## 2013-12-03 NOTE — Telephone Encounter (Signed)
Preadmission screen  

## 2013-12-05 ENCOUNTER — Encounter (HOSPITAL_COMMUNITY): Payer: Self-pay | Admitting: *Deleted

## 2013-12-05 ENCOUNTER — Inpatient Hospital Stay (HOSPITAL_COMMUNITY)
Admission: AD | Admit: 2013-12-05 | Discharge: 2013-12-08 | DRG: 775 | Disposition: A | Discharge status: Home / Self Care | Payer: BC Managed Care – PPO | Source: Ambulatory Visit | Attending: Obstetrics and Gynecology | Admitting: Obstetrics and Gynecology

## 2013-12-05 DIAGNOSIS — O9989 Other specified diseases and conditions complicating pregnancy, childbirth and the puerperium: Secondary | ICD-10-CM

## 2013-12-05 DIAGNOSIS — O99892 Other specified diseases and conditions complicating childbirth: Secondary | ICD-10-CM | POA: Diagnosis present

## 2013-12-05 DIAGNOSIS — O9903 Anemia complicating the puerperium: Secondary | ICD-10-CM | POA: Diagnosis not present

## 2013-12-05 DIAGNOSIS — O480 Post-term pregnancy: Principal | ICD-10-CM | POA: Diagnosis present

## 2013-12-05 DIAGNOSIS — Z22330 Carrier of Group B streptococcus: Secondary | ICD-10-CM

## 2013-12-05 DIAGNOSIS — Y9210 Unspecified residential institution as the place of occurrence of the external cause: Secondary | ICD-10-CM | POA: Diagnosis not present

## 2013-12-05 DIAGNOSIS — E538 Deficiency of other specified B group vitamins: Secondary | ICD-10-CM | POA: Diagnosis present

## 2013-12-05 DIAGNOSIS — D649 Anemia, unspecified: Secondary | ICD-10-CM | POA: Diagnosis not present

## 2013-12-05 DIAGNOSIS — T394X5A Adverse effect of antirheumatics, not elsewhere classified, initial encounter: Secondary | ICD-10-CM | POA: Diagnosis not present

## 2013-12-05 NOTE — MAU Note (Signed)
Patient presents to MAU with c/o contractions every 2 minutes. Denies LOF or VB at this time. Reports good fetal movement.

## 2013-12-05 NOTE — L&D Delivery Note (Signed)
Delivery Note At 5:22 PM a viable female, "Abigail Park", was delivered via Vaginal, Spontaneous Delivery (Presentation: Right Occiput Anterior).  APGAR: 8, 9; weight 7 lb 12.9 oz (3540 g).   Placenta status: Intact, Spontaneous.  Cord: 3 vessels with the following complications: None.  Cord pH: NA  Anesthesia: Epidural  Episiotomy: None Lacerations: 2nd degree vaginal, bifurcation of right labia minora--repaired by Dr. Normand Sloopillard under existing epidural anesthesia. Suture Repair: 3.0 vicryl Est. Blood Loss (mL): 200 cc  Mom to postpartum.  Baby to Couplet care / Skin to Skin.  Nigel BridgemanLATHAM, Abigail Park 12/06/2013, 7:09 PM

## 2013-12-05 NOTE — H&P (Signed)
Abigail Park is a 28 y.o. female, G1P0 at [redacted]w[redacted]d, presenting for labor check.  UCs experienced all day however just recently they were about Q 2 min and pt is having to breath through them.  Reports small amount of blood show.  Denies LOF, recent fever, resp or GI c/o's, UTI or PIH s/s. GFM.   Patient Active Problem List   Diagnosis Date Noted  . Active labor at term 12/06/2013  . Folic acid deficiency 12/06/2013  . Anxiety 12/06/2013  . Disorder of intestine 12/06/2013  . Post term pregnancy over 40 weeks 12/06/2013  . Ibuprofen adverse reaction 12/06/2013    History of present pregnancy: Patient entered care at 10 weeks.   EDC of 11/28/13 was established by LMP.   Anatomy scan:  19 weeks, with normal findings and an posterior placenta.   Additional [place] evaluations:  [redacted]w[redacted]d for growth and h/o 2nd trim spotting - 51st%ile, vtx, normal fluid.   Significant prenatal events:  None   Last evaluation:  12/04/13 at [redacted]w[redacted]d  NST reactive  OB History   Grav Para Term Preterm Abortions TAB SAB Ect Mult Living   1              Past Medical History  Diagnosis Date  . Allergy   . IBS (irritable bowel syndrome)   . Ulcer   . Chronic kidney disease     kidney stones  . Infection     UTI  . Anxiety     on meds- helps  . Hx of varicella   . Headache(784.0)     migraines  . Birth defect     incomplete development of intestines, perforated   Past Surgical History  Procedure Laterality Date  . Colon surgery      2x  . Wisdom tooth extraction    . Colon biopsy      benign  . Removal of scar tissue  2012    intestinal blockage  . Intestinal reconstruction  89   Family History: family history includes Asthma in her mother; Crohn's disease in her father; Hearing loss in her mother; Hypertension in her mother. Social History:  reports that she has never smoked. She has never used smokeless tobacco. She reports that she drinks alcohol. She reports that she does not use illicit  drugs.   Prenatal Transfer Tool  Maternal Diabetes: No Genetic Screening: Declined Maternal Ultrasounds/Referrals: Normal Fetal Ultrasounds or other Referrals:  None Maternal Substance Abuse:  No Significant Maternal Medications:  Meds include: Other: Prozac Significant Maternal Lab Results: Lab values include: Group B Strep positive    ROS: see HPI above, all other systems are negative   Allergies  Allergen Reactions  . Motrin [Ibuprofen] Hives     Dilation: 7 Effacement (%): 100 Station: 0 Exam by:: S Nix RN Blood pressure 119/57, pulse 77, temperature 97.5 F (36.4 C), temperature source Oral, resp. rate 18, height 5\' 3"  (1.6 m), weight 179 lb 3.2 oz (81.285 kg), SpO2 99.00%.  Chest clear Heart RRR without murmur Abd gravid, NT Ext: WNL  FHR: Reactive  UCs:  Q 2-5 min  Prenatal labs: ABO, Rh: O/Positive/-- (05/08 0000) Antibody: Negative (05/08 0000) Rubella:   Immune RPR: Nonreactive (05/08 0000)  HBsAg: Negative (05/08 0000)  HIV: Non-reactive (05/08 0000)  GBS: Positive (11/24 0000) Sickle cell/Hgb electrophoresis:  n/a Pap:  Last pap 09/2012 GC:  Neg Chlamydia:  Neg Genetic screenings:  Declined Glucola:  98 Other:  None   Assessment/Plan: IUP at  3816w0d  Active labor GBS pos Desires epidural   Admit to BS per c/w Dr. Estanislado Pandyivard as attending MD Routine L&D orders GBS prophylaxis Epidural prn   Rowan BlaseXLEY, JENNIFERCNM, MSN 12/06/2013, 4:44 AM

## 2013-12-05 NOTE — MAU Note (Signed)
Contractions every 2 min.

## 2013-12-06 ENCOUNTER — Encounter (HOSPITAL_COMMUNITY): Payer: Self-pay | Admitting: *Deleted

## 2013-12-06 ENCOUNTER — Inpatient Hospital Stay (HOSPITAL_COMMUNITY): Payer: BC Managed Care – PPO | Admitting: Anesthesiology

## 2013-12-06 ENCOUNTER — Encounter (HOSPITAL_COMMUNITY): Payer: BC Managed Care – PPO | Admitting: Anesthesiology

## 2013-12-06 DIAGNOSIS — K639 Disease of intestine, unspecified: Secondary | ICD-10-CM | POA: Insufficient documentation

## 2013-12-06 DIAGNOSIS — T39315A Adverse effect of propionic acid derivatives, initial encounter: Secondary | ICD-10-CM | POA: Insufficient documentation

## 2013-12-06 DIAGNOSIS — E538 Deficiency of other specified B group vitamins: Secondary | ICD-10-CM | POA: Insufficient documentation

## 2013-12-06 DIAGNOSIS — F419 Anxiety disorder, unspecified: Secondary | ICD-10-CM | POA: Insufficient documentation

## 2013-12-06 LAB — CBC
HCT: 36 % (ref 36.0–46.0)
HEMOGLOBIN: 12.2 g/dL (ref 12.0–15.0)
MCH: 28 pg (ref 26.0–34.0)
MCHC: 33.9 g/dL (ref 30.0–36.0)
MCV: 82.6 fL (ref 78.0–100.0)
PLATELETS: 247 10*3/uL (ref 150–400)
RBC: 4.36 MIL/uL (ref 3.87–5.11)
RDW: 13.2 % (ref 11.5–15.5)
WBC: 12.7 10*3/uL — ABNORMAL HIGH (ref 4.0–10.5)

## 2013-12-06 LAB — RPR: RPR Ser Ql: NONREACTIVE

## 2013-12-06 MED ORDER — ONDANSETRON HCL 4 MG/2ML IJ SOLN
4.0000 mg | Freq: Four times a day (QID) | INTRAMUSCULAR | Status: DC | PRN
  Administered 2013-12-06 (×2): 4 mg via INTRAVENOUS
  Filled 2013-12-06 (×2): qty 2

## 2013-12-06 MED ORDER — LACTATED RINGERS IV SOLN
500.0000 mL | INTRAVENOUS | Status: DC | PRN
  Administered 2013-12-06: 1000 mL via INTRAVENOUS

## 2013-12-06 MED ORDER — LIDOCAINE HCL (PF) 1 % IJ SOLN
INTRAMUSCULAR | Status: DC | PRN
  Administered 2013-12-06 (×2): 4 mL

## 2013-12-06 MED ORDER — IBUPROFEN 600 MG PO TABS: 600.0000 mg | ORAL_TABLET | Freq: Four times a day (QID) | ORAL | Status: DC

## 2013-12-06 MED ORDER — OXYCODONE-ACETAMINOPHEN 5-325 MG PO TABS
1.0000 | ORAL_TABLET | ORAL | Status: DC | PRN
  Administered 2013-12-06: 1 via ORAL
  Administered 2013-12-07 – 2013-12-08 (×8): 2 via ORAL
  Filled 2013-12-06 (×2): qty 2
  Filled 2013-12-06: qty 1
  Filled 2013-12-06 (×6): qty 2

## 2013-12-06 MED ORDER — EPHEDRINE 5 MG/ML INJ
10.0000 mg | INTRAVENOUS | Status: DC | PRN
  Filled 2013-12-06: qty 4
  Filled 2013-12-06: qty 2

## 2013-12-06 MED ORDER — DIPHENHYDRAMINE HCL 50 MG/ML IJ SOLN: 12.5000 mg | INTRAMUSCULAR | Status: DC | PRN

## 2013-12-06 MED ORDER — SIMETHICONE 80 MG PO CHEW
80.0000 mg | CHEWABLE_TABLET | ORAL | Status: DC | PRN
Start: 2013-12-06 — End: 2013-12-08

## 2013-12-06 MED ORDER — PHENYLEPHRINE 40 MCG/ML (10ML) SYRINGE FOR IV PUSH (FOR BLOOD PRESSURE SUPPORT)
80.0000 ug | PREFILLED_SYRINGE | INTRAVENOUS | Status: DC | PRN
  Filled 2013-12-06: qty 2

## 2013-12-06 MED ORDER — WITCH HAZEL-GLYCERIN EX PADS
1.0000 | MEDICATED_PAD | CUTANEOUS | Status: DC | PRN
Start: 2013-12-06 — End: 2013-12-08

## 2013-12-06 MED ORDER — LIDOCAINE HCL (PF) 1 % IJ SOLN
30.0000 mL | INTRAMUSCULAR | Status: AC | PRN
  Administered 2013-12-06: 30 mL via SUBCUTANEOUS
  Filled 2013-12-06 (×2): qty 30

## 2013-12-06 MED ORDER — DEXTROSE 5 % IV SOLN
5.0000 10*6.[IU] | Freq: Once | INTRAVENOUS | Status: AC
  Administered 2013-12-06: 5 10*6.[IU] via INTRAVENOUS
  Filled 2013-12-06: qty 5

## 2013-12-06 MED ORDER — CITRIC ACID-SODIUM CITRATE 334-500 MG/5ML PO SOLN
30.0000 mL | ORAL | Status: DC | PRN
  Administered 2013-12-06: 30 mL via ORAL
  Filled 2013-12-06: qty 15

## 2013-12-06 MED ORDER — PRENATAL MULTIVITAMIN CH
1.0000 | ORAL_TABLET | Freq: Every day | ORAL | Status: DC
  Administered 2013-12-07: 1 via ORAL
  Filled 2013-12-06: qty 1

## 2013-12-06 MED ORDER — BENZOCAINE-MENTHOL 20-0.5 % EX AERO
1.0000 "application " | INHALATION_SPRAY | CUTANEOUS | Status: DC | PRN
  Administered 2013-12-06: 1 via TOPICAL
  Filled 2013-12-06: qty 56

## 2013-12-06 MED ORDER — DIBUCAINE 1 % RE OINT
1.0000 | TOPICAL_OINTMENT | RECTAL | Status: DC | PRN
Start: 2013-12-06 — End: 2013-12-08

## 2013-12-06 MED ORDER — EPHEDRINE 5 MG/ML INJ
10.0000 mg | INTRAVENOUS | Status: DC | PRN
  Filled 2013-12-06: qty 2

## 2013-12-06 MED ORDER — PENICILLIN G POTASSIUM 5000000 UNITS IJ SOLR
2.5000 10*6.[IU] | INTRAVENOUS | Status: DC
  Administered 2013-12-06 (×3): 2.5 10*6.[IU] via INTRAVENOUS
  Filled 2013-12-06 (×7): qty 2.5

## 2013-12-06 MED ORDER — OXYTOCIN 40 UNITS IN LACTATED RINGERS INFUSION - SIMPLE MED
62.5000 mL/h | INTRAVENOUS | Status: DC
  Filled 2013-12-06: qty 1000

## 2013-12-06 MED ORDER — LACTATED RINGERS IV SOLN
500.0000 mL | Freq: Once | INTRAVENOUS | Status: AC
  Administered 2013-12-06: 500 mL via INTRAVENOUS

## 2013-12-06 MED ORDER — OXYTOCIN BOLUS FROM INFUSION: 500.0000 mL | INTRAVENOUS | Status: DC

## 2013-12-06 MED ORDER — ZOLPIDEM TARTRATE 5 MG PO TABS: 5.0000 mg | ORAL_TABLET | Freq: Every evening | ORAL | Status: DC | PRN

## 2013-12-06 MED ORDER — DIPHENHYDRAMINE HCL 25 MG PO CAPS: 25.0000 mg | ORAL_CAPSULE | Freq: Four times a day (QID) | ORAL | Status: DC | PRN

## 2013-12-06 MED ORDER — OXYCODONE-ACETAMINOPHEN 5-325 MG PO TABS: 1.0000 | ORAL_TABLET | ORAL | Status: DC | PRN

## 2013-12-06 MED ORDER — ACETAMINOPHEN 325 MG PO TABS: 650.0000 mg | ORAL_TABLET | ORAL | Status: DC | PRN

## 2013-12-06 MED ORDER — PHENYLEPHRINE 40 MCG/ML (10ML) SYRINGE FOR IV PUSH (FOR BLOOD PRESSURE SUPPORT)
80.0000 ug | PREFILLED_SYRINGE | INTRAVENOUS | Status: DC | PRN
  Filled 2013-12-06: qty 2
  Filled 2013-12-06: qty 10

## 2013-12-06 MED ORDER — LANOLIN HYDROUS EX OINT: TOPICAL_OINTMENT | CUTANEOUS | Status: DC | PRN

## 2013-12-06 MED ORDER — SENNOSIDES-DOCUSATE SODIUM 8.6-50 MG PO TABS
2.0000 | ORAL_TABLET | ORAL | Status: DC
  Administered 2013-12-07 – 2013-12-08 (×2): 2 via ORAL
  Filled 2013-12-06 (×2): qty 2

## 2013-12-06 MED ORDER — TERBUTALINE SULFATE 1 MG/ML IJ SOLN: 0.2500 mg | Freq: Once | INTRAMUSCULAR | Status: DC | PRN

## 2013-12-06 MED ORDER — FENTANYL 2.5 MCG/ML BUPIVACAINE 1/10 % EPIDURAL INFUSION (WH - ANES)
14.0000 mL/h | INTRAMUSCULAR | Status: DC | PRN
  Filled 2013-12-06 (×3): qty 125

## 2013-12-06 MED ORDER — OXYTOCIN 40 UNITS IN LACTATED RINGERS INFUSION - SIMPLE MED
1.0000 m[IU]/min | INTRAVENOUS | Status: DC
  Administered 2013-12-06: 666 m[IU]/min via INTRAVENOUS
  Administered 2013-12-06: 2 m[IU]/min via INTRAVENOUS

## 2013-12-06 MED ORDER — FENTANYL CITRATE 0.05 MG/ML IJ SOLN: 100.0000 ug | INTRAMUSCULAR | Status: DC | PRN

## 2013-12-06 MED ORDER — FENTANYL 2.5 MCG/ML BUPIVACAINE 1/10 % EPIDURAL INFUSION (WH - ANES)
INTRAMUSCULAR | Status: DC | PRN
  Administered 2013-12-06: 14 mL/h via EPIDURAL

## 2013-12-06 MED ORDER — ONDANSETRON HCL 4 MG/2ML IJ SOLN
4.0000 mg | INTRAMUSCULAR | Status: DC | PRN
Start: 2013-12-06 — End: 2013-12-08

## 2013-12-06 MED ORDER — LACTATED RINGERS IV SOLN
INTRAVENOUS | Status: DC
  Administered 2013-12-06: 01:00:00 via INTRAVENOUS

## 2013-12-06 MED ORDER — TETANUS-DIPHTH-ACELL PERTUSSIS 5-2.5-18.5 LF-MCG/0.5 IM SUSP: 0.5000 mL | Freq: Once | INTRAMUSCULAR | Status: DC

## 2013-12-06 MED ORDER — ONDANSETRON HCL 4 MG PO TABS: 4.0000 mg | ORAL_TABLET | ORAL | Status: DC | PRN

## 2013-12-06 NOTE — Progress Notes (Signed)
Patient was referred for history of depression/anxiety. * Referral screened out by Clinical Social Worker because none of the following criteria appear to apply: ~ History of anxiety/depression during this pregnancy, or of post-partum depression. ~ Diagnosis of anxiety and/or depression within last 3 years ~ History of depression due to pregnancy loss/loss of child OR * Patient's symptoms currently being treated with medication and/or therapy. Please contact the Clinical Social Worker if needs arise, or if patient requests.  MOB has rx for Prozac. 

## 2013-12-06 NOTE — Progress Notes (Signed)
  Subjective: Some nausea, but no vomiting.  Feeling some pressure.  Objective: BP 108/64  Pulse 78  Temp(Src) 98.1 F (36.7 C) (Oral)  Resp 18  Ht 5\' 3"  (1.6 m)  Wt 179 lb 3.2 oz (81.285 kg)  BMI 31.75 kg/m2  SpO2 99%      FHT: Category 2--run of 3-4 late decels, with reassuring FHR prior and subsequently.  BP 119/59.  Fluid bolus given. UC:   regular, every 2-4 minutes SVE:   Dilation: 10 Effacement (%): 100 Station: +1 Exam by:: Nigel BridgemanVicki Joandry Slagter,. cnm Pitocin on 8 mu/min  Assessment / Plan: 2nd stage labor Small amount descent with trial push--will labor down at present. Zofran IV now.  Sumiye Hirth 12/06/2013, 1:24 PM

## 2013-12-06 NOTE — Progress Notes (Signed)
Patient ID: Abigail Park, female   DOB: January 22, 1986, 28 y.o.   MRN: [id number] Pt with laceration of right labia minora.  It was repaired with 3-0 vicryl Second degree perineal laceration repaired with 2-0 vicryl in a normal fashion EBL about 200 ml

## 2013-12-06 NOTE — Anesthesia Procedure Notes (Signed)
Epidural Patient location during procedure: OB Start time: 12/06/2013 2:39 AM  Staffing Anesthesiologist: Keshawn Sundberg A.  Preanesthetic Checklist Completed: patient identified, site marked, surgical consent, pre-op evaluation, timeout performed, IV checked, risks and benefits discussed and monitors and equipment checked  Epidural Patient position: sitting Prep: site prepped and draped and DuraPrep Patient monitoring: continuous pulse ox and blood pressure Approach: midline Injection technique: LOR air  Needle:  Needle type: Tuohy  Needle gauge: 17 G Needle length: 9 cm and 9 Needle insertion depth: 4 cm Catheter type: closed end flexible Catheter size: 19 Gauge Catheter at skin depth: 10 cm Test dose: negative and Other  Assessment Events: blood not aspirated, injection not painful, no injection resistance, negative IV test and no paresthesia  Additional Notes Patient identified. Risks and benefits discussed including failed block, incomplete  Pain control, post dural puncture headache, nerve damage, paralysis, blood pressure Changes, nausea, vomiting, reactions to medications-both toxic and allergic and post Partum back pain. All questions were answered. Patient expressed understanding and wished to proceed. Sterile technique was used throughout procedure. Epidural site was Dressed with sterile barrier dressing. No paresthesias, signs of intravascular injection Or signs of intrathecal spread were encountered.  Patient was more comfortable after the epidural was dosed. Please see RN's note for documentation of vital signs and FHR which are stable.

## 2013-12-06 NOTE — Progress Notes (Signed)
  Subjective: Feeling some pressure.  Objective: BP 121/69  Pulse 73  Temp(Src) 98 F (36.7 C) (Oral)  Resp 18  Ht 5\' 3"  (1.6 m)  Wt 179 lb 3.2 oz (81.285 kg)  BMI 31.75 kg/m2  SpO2 99%      FHT: Category 2, with early decels, reassuring. UC:   regular, every 2-3 minutes SVE:   Dilation: Lip/rim Effacement (%): 100 Station: 0 Exam by:: Nigel BridgemanVicki Leiah Giannotti, cnm Pitocin on 8 mu/min  Assessment / Plan: Progressive labor Will position in exaggerated sims to facilitate rotation and descent.  Preslea Rhodus 12/06/2013, 11:09 AM

## 2013-12-06 NOTE — Progress Notes (Signed)
  Subjective: Comfortable with epidural.  Objective: BP 105/64  Pulse 86  Temp(Src) 98.4 F (36.9 C) (Oral)  Resp 18  Ht 5\' 3"  (1.6 m)  Wt 179 lb 3.2 oz (81.285 kg)  BMI 31.75 kg/m2  SpO2 99%      FHT:  Category 1 UC:   irregular, every 6-8 minutes SVE:   Dilation: 9 Effacement (%): 100 Station: 0 Exam by:: Nigel BridgemanVicki Kadejah Sandiford, cnm BBOW--AROM, moderate MSF Pelvis feels adequate  Assessment / Plan: Transitional labor Will observe UC status after AROM--augment if no change in contractions within 30 min. Dr. Normand Sloopillard updated.  Nigel BridgemanLATHAM, Levaughn Puccinelli 12/06/2013, [phone]

## 2013-12-06 NOTE — Anesthesia Preprocedure Evaluation (Signed)
Anesthesia Evaluation  Patient identified by MRN, date of birth, ID band Patient awake    Reviewed: Allergy & Precautions, H&P , Patient's Chart, lab work & pertinent test results  Airway Mallampati: III TM Distance: >3 FB Neck ROM: Full    Dental no notable dental hx. (+) Teeth Intact   Pulmonary neg pulmonary ROS,  breath sounds clear to auscultation  Pulmonary exam normal       Cardiovascular negative cardio ROS  Rhythm:Regular Rate:Normal     Neuro/Psych  Headaches, Anxiety    GI/Hepatic Neg liver ROS, PUD, Hx/o GI birth defect- ?malrotation S/P colon resection IBS   Endo/Other  obesity  Renal/GU Renal disease  negative genitourinary   Musculoskeletal negative musculoskeletal ROS (+)   Abdominal (+) + obese,   Peds  (+) Gastroesophagael problems Hematology negative hematology ROS (+)   Anesthesia Other Findings   Reproductive/Obstetrics (+) Pregnancy                           Anesthesia Physical Anesthesia Plan  ASA: II  Anesthesia Plan: Epidural   Post-op Pain Management:    Induction:   Airway Management Planned: Natural Airway  Additional Equipment:   Intra-op Plan:   Post-operative Plan:   Informed Consent: I have reviewed the patients History and Physical, chart, labs and discussed the procedure including the risks, benefits and alternatives for the proposed anesthesia with the patient or authorized representative who has indicated his/her understanding and acceptance.     Plan Discussed with: Anesthesiologist  Anesthesia Plan Comments:         Anesthesia Quick Evaluation

## 2013-12-07 LAB — CBC
HCT: 29.1 % — ABNORMAL LOW (ref 36.0–46.0)
HEMOGLOBIN: 9.6 g/dL — AB (ref 12.0–15.0)
MCH: 27.9 pg (ref 26.0–34.0)
MCHC: 33 g/dL (ref 30.0–36.0)
MCV: 84.6 fL (ref 78.0–100.0)
Platelets: 165 10*3/uL (ref 150–400)
RBC: 3.44 MIL/uL — ABNORMAL LOW (ref 3.87–5.11)
RDW: 13.7 % (ref 11.5–15.5)
WBC: 10.9 10*3/uL — ABNORMAL HIGH (ref 4.0–10.5)

## 2013-12-07 MED ORDER — FERROUS SULFATE 325 (65 FE) MG PO TABS
325.0000 mg | ORAL_TABLET | Freq: Every day | ORAL | Status: DC
  Administered 2013-12-07: 325 mg via ORAL
  Filled 2013-12-07: qty 1

## 2013-12-07 NOTE — Lactation Note (Addendum)
This note was copied from the chart of Abigail Lesleigh Noenna Emel. Lactation Consultation Note Initial Consult:  Baby 17 hours, first time mom.  Baby sleeping.  Mother states breastfeeding going well, baby eats for 20-30 minutes, she hears swallows, latches easily and falls asleep.  According to mom with early breastfeeding, baby did suck hard and give mom a bruise which is no longer hurting.  Baby has stooled but not yet voided which is appropriate for her age. Mom states she has been taught hand expression and viewed colostrum. Reviewed breastfeeding basics, deep latch, feeding cues and lactation support services and brochure.  Encouraged mother to call for assistance or questions.  Will need for RN or lactation to view latch.  Patient Name: Abigail Park WGNFA'OToday's Date: 12/07/2013 Reason for consult: Initial assessment   Maternal Data    Feeding Feeding Type: Breast Fed Length of feed: 20 min  LATCH Score/Interventions                      Lactation Tools Discussed/Used     Consult Status Consult Status: Follow-up Date: 12/07/13 Follow-up type: In-patient    Dahlia ByesBerkelhammer, Aariana Shankland [facility] 12/07/2013, 11:03 AM

## 2013-12-07 NOTE — Progress Notes (Signed)
Post Partum Day 1 Subjective: Reports feeling well except tired due to little sleep.  Ambulating, voiding and tol po liquids and solids without difficulty.  Denies weakness or dizziness.  Reports perineal pain controlled with meds.  Working on breastfeeding.  Objective: Blood pressure 110/55, pulse 63, temperature 97.9 F (36.6 C), temperature source Oral, resp. rate 18, height 5\' 3"  (1.6 m), weight 81.285 kg (179 lb 3.2 oz), SpO2 97.00%, unknown if currently breastfeeding. Filed Vitals:   12/06/13 1903 12/06/13 2035 12/07/13 0135 12/07/13 0534  BP: 120/73 115/68 106/61 110/55  Pulse: 72 69 69 63  Temp:  98.1 F (36.7 C) 98 F (36.7 C) 97.9 F (36.6 C)  TempSrc:  Oral Oral Oral  Resp:  18 18 18   Height:      Weight:      SpO2:  96% 97%    Physical Exam:  General: alert, cooperative and no distress Heart:  RRR Lungs:  CTA bilat Abd:  Soft, NT with pos BS x 4 quads Lochia: appropriate, sm rubra Uterine Fundus: firm, NT 1 below umb Incision: Perineal and labial repairs intact DVT Evaluation: No evidence of DVT seen on physical exam. Negative Homan's sign bilat. No significant calf/ankle edema.   Recent Labs  12/06/13 0040 12/07/13 0500  HGB 12.2 9.6*  HCT 36.0 29.1*    Assessment/Plan: Stable s/p vag delivery Asymptomatic anemia  Continue current care. Begin Ferrous Sulfate qd due to anemia. Anticipate discharge tomorrow.    LOS: 2 days   Porche Steinberger O. 12/07/2013, 12:45 PM

## 2013-12-07 NOTE — Anesthesia Postprocedure Evaluation (Signed)
  Anesthesia Post-op Note  Patient: Abigail Park  Procedure(s) Performed: * No procedures listed *  Patient Location: PACU and Mother/Baby  Anesthesia Type:Epidural  Level of Consciousness: awake, alert  and oriented  Airway and Oxygen Therapy: Patient Spontanous Breathing  Post-op Pain: none  Post-op Assessment: Post-op Vital signs reviewed, Patient's Cardiovascular Status Stable, No headache, No backache, No residual numbness and No residual motor weakness  Post-op Vital Signs: Reviewed and stable  Complications: No apparent anesthesia complications

## 2013-12-08 ENCOUNTER — Inpatient Hospital Stay (HOSPITAL_COMMUNITY): Admission: RE | Admit: 2013-12-08 | Payer: BC Managed Care – PPO | Source: Ambulatory Visit

## 2013-12-08 MED ORDER — OXYCODONE-ACETAMINOPHEN 5-325 MG PO TABS: 1.0000 | ORAL_TABLET | ORAL | Status: DC | PRN

## 2013-12-08 NOTE — Discharge Summary (Signed)
Vaginal Delivery Discharge Summary  Abigail Park  DOB:    [birth year] MRN:    [id number] CSN:    [id number]  Date of admission:                  12/05/13  Date of discharge:                   12/08/13  Procedures this admission:  Epidural, repair of right labia minora  Date of Delivery: 12/06/13  Newborn Data:  Live born female  Birth Weight: 7 lb 12.9 oz (3540 g) APGAR: 8, 9  Home with mother. Name: Abigail Park   History of Present Illness:  [name] is a 28 y.o. female, G1P1001, who presents at 5091w1d weeks gestation. The patient has been followed at the [facility] division of [facility]. She was admitted onset of labor. Her pregnancy has been complicated by  Patient Active Problem List   Diagnosis Date Noted  . Obstetric labial laceration, delivered, current hospitalization 12/08/2013  . Active labor at term 12/06/2013  . Folic acid deficiency 12/06/2013  . Anxiety 12/06/2013  . Disorder of intestine 12/06/2013  . Post term pregnancy over 40 weeks 12/06/2013  . Ibuprofen adverse reaction 12/06/2013  . Vaginal delivery 12/06/2013     Hospital course:  The patient was admitted for early labor..   Her labor was not complicated. She proceeded to have a vaginal delivery attended by Nigel BridgemanVicki Kelin Borum, CNM.  Her delivery was complicated by a bifurcation of the right labia at the time of delivery, which was repaired by Dr. Normand Sloopillard.. Her postpartum course was not complicated. She was discharged to home on postpartum day 2 doing well.  She was given a sitz bath to use at home.  Feeding:  breast  Contraception:  Undecided at present  Discharge hemoglobin:  Hemoglobin  Date Value Range Status  12/07/2013 9.6* 12.0 - 15.0 g/dL Final     REPEATED TO VERIFY     DELTA CHECK NOTED     HCT  Date Value Range Status  12/07/2013 29.1* 36.0 - 46.0 % Final    Discharge Physical Exam:   General: alert Lochia:  appropriate Uterine Fundus: firm Incision: healing well DVT Evaluation: No evidence of DVT seen on physical exam. Negative Homan's sign.  Intrapartum Procedures: spontaneous vaginal delivery Postpartum Procedures: none Complications-Operative and Postpartum: 2nd  degree perineal laceration, right labia minora laceration  Discharge Diagnoses: Term Pregnancy-delivered  Discharge Information:  Activity:           Per [facility] handout Diet:                routine Medications: Percocet and OTC Fe, Prozac (on prior to delivery) Condition:      stable Instructions:   Postpartum Care After Vaginal Delivery  After you deliver your newborn (postpartum period), the usual stay in the hospital is 24 72 hours. If there were problems with your labor or delivery, or if you have other medical problems, you might be in the hospital longer.  While you are in the hospital, you will receive help and instructions on how to care for yourself and your newborn during the postpartum period.  While you are in the hospital:  Be sure to tell your nurses if you have pain or discomfort, as well as where you feel the pain and what makes the pain worse.  If you had an incision made near your vagina (episiotomy) or if  you had some tearing during delivery, the nurses may put ice packs on your episiotomy or tear. The ice packs may help to reduce the pain and swelling.  If you are breastfeeding, you may feel uncomfortable contractions of your uterus for a couple of weeks. This is normal. The contractions help your uterus get back to normal size.  It is normal to have some bleeding after delivery.  For the first 1 3 days after delivery, the flow is red and the amount may be similar to a period.  It is common for the flow to start and stop.  In the first few days, you may pass some small clots. Let your nurses know if you begin to pass large clots or your flow increases.  Do not  flush blood clots down the toilet before  having the nurse look at them.  During the next 3 10 days after delivery, your flow should become more watery and pink or brown-tinged in color.  Ten to fourteen days after delivery, your flow should be a small amount of yellowish-white discharge.  The amount of your flow will decrease over the first few weeks after delivery. Your flow may stop in 6 8 weeks. Most women have had their flow stop by 12 weeks after delivery.  You should change your sanitary pads frequently.  Wash your hands thoroughly with soap and water for at least 20 seconds after changing pads, using the toilet, or before holding or feeding your newborn.  You should feel like you need to empty your bladder within the first 6 8 hours after delivery.  In case you become weak, lightheaded, or faint, call your nurse before you get out of bed for the first time and before you take a shower for the first time.  Within the first few days after delivery, your breasts may begin to feel tender and full. This is called engorgement. Breast tenderness usually goes away within 48 72 hours after engorgement occurs. You may also notice milk leaking from your breasts. If you are not breastfeeding, do not stimulate your breasts. Breast stimulation can make your breasts produce more milk.  Spending as much time as possible with your newborn is very important. During this time, you and your newborn can feel close and get to know each other. Having your newborn stay in your room (rooming in) will help to strengthen the bond with your newborn. It will give you time to get to know your newborn and become comfortable caring for your newborn.  Your hormones change after delivery. Sometimes the hormone changes can temporarily cause you to feel sad or tearful. These feelings should not last more than a few days. If these feelings last longer than that, you should talk to your caregiver.  If desired, talk to your caregiver about methods of family  planning or contraception.  Talk to your caregiver about immunizations. Your caregiver may want you to have the following immunizations before leaving the hospital:  Tetanus, diphtheria, and pertussis (Tdap) or tetanus and diphtheria (Td) immunization. It is very important that you and your family (including grandparents) or others caring for your newborn are up-to-date with the Tdap or Td immunizations. The Tdap or Td immunization can help protect your newborn from getting ill.  Rubella immunization.  Varicella (chickenpox) immunization.  Influenza immunization. You should receive this annual immunization if you did not receive the immunization during your pregnancy. Document Released: 09/18/2007 Document Revised: 08/15/2012 Document Reviewed: 07/18/2012 [employer] Patient Information 2014 [employer].   Postpartum Depression and Baby Blues  The postpartum period begins right after the birth of a baby. During this time, there is often a great amount of joy and excitement. It is also a time of considerable changes in the life of the parent(s). Regardless of how many times a mother gives birth, each child brings new challenges and dynamics to the family. It is not unusual to have feelings of excitement accompanied by confusing shifts in moods, emotions, and thoughts. All mothers are at risk of developing postpartum depression or the "baby blues." These mood changes can occur right after giving birth, or they may occur many months after giving birth. The baby blues or postpartum depression can be mild or severe. Additionally, postpartum depression can resolve rather quickly, or it can be a long-term condition. CAUSES Elevated hormones and their rapid decline are thought to be a main cause of postpartum depression and the baby blues. There are a number of hormones that radically change during and after pregnancy. Estrogen and progesterone usually decrease immediately after delivering your baby. The  level of thyroid hormone and various cortisol steroids also rapidly drop. Other factors that play a major role in these changes include major life events and genetics.  RISK FACTORS If you have any of the following risks for the baby blues or postpartum depression, know what symptoms to watch out for during the postpartum period. Risk factors that may increase the likelihood of getting the baby blues or postpartum depression include:  Havinga personal or family history of depression.  Having depression while being pregnant.  Having premenstrual or oral contraceptive-associated mood issues.  Having exceptional life stress.  Having marital conflict.  Lacking a social support network.  Having a baby with special needs.  Having health problems such as diabetes. SYMPTOMS Baby blues symptoms include:  Brief fluctuations in mood, such as going from extreme happiness to sadness.  Decreased concentration.  Difficulty sleeping.  Crying spells, tearfulness.  Irritability.  Anxiety. Postpartum depression symptoms typically begin within the first month after giving birth. These symptoms include:  Difficulty sleeping or excessive sleepiness.  Marked weight loss.  Agitation.  Feelings of worthlessness.  Lack of interest in activity or food. Postpartum psychosis is a very concerning condition and can be dangerous. Fortunately, it is rare. Displaying any of the following symptoms is cause for immediate medical attention. Postpartum psychosis symptoms include:  Hallucinations and delusions.  Bizarre or disorganized behavior.  Confusion or disorientation. DIAGNOSIS  A diagnosis is made by an evaluation of your symptoms. There are no medical or lab tests that lead to a diagnosis, but there are various questionnaires that a caregiver may use to identify those with the baby blues, postpartum depression, or psychosis. Often times, a screening tool called the [place] Postnatal  Depression Scale is used to diagnose depression in the postpartum period.  TREATMENT The baby blues usually goes away on its own in 1 to 2 weeks. Social support is often all that is needed. You should be encouraged to get adequate sleep and rest. Occasionally, you may be given medicines to help you sleep.  Postpartum depression requires treatment as it can last several months or longer if it is not treated. Treatment may include individual or group therapy, medicine, or both to address any social, physiological, and psychological factors that may play a role in the depression. Regular exercise, a healthy diet, rest, and social support may also be strongly recommended.  Postpartum psychosis is more serious and needs  treatment right away. Hospitalization is often needed. HOME CARE INSTRUCTIONS  Get as much rest as you can. Nap when the baby sleeps.  Exercise regularly. Some women find yoga and walking to be beneficial.  Eat a balanced and nourishing diet.  Do little things that you enjoy. Have a cup of tea, take a bubble bath, read your favorite magazine, or listen to your favorite music.  Avoid alcohol.  Ask for help with household chores, cooking, grocery shopping, or running errands as needed. Do not try to do everything.  Talk to people close to you about how you are feeling. Get support from your partner, family members, friends, or other new moms.  Try to stay positive in how you think. Think about the things you are grateful for.  Do not spend a lot of time alone.  Only take medicine as directed by your caregiver.  Keep all your postpartum appointments.  Let your caregiver know if you have any concerns. SEEK MEDICAL CARE IF: You are having a reaction or problems with your medicine. SEEK IMMEDIATE MEDICAL CARE IF:  You have suicidal feelings.  You feel you may harm the baby or someone else. Document Released: 08/25/2004 Document Revised: 02/13/2012 Document Reviewed:  09/27/2011 [facility] Patient Information 2014 [place], [place].   Discharge to: home  Follow-up Information   [facility] Schedule an appointment as soon as possible for a visit in 6 weeks. (Call for any questions or concerns.)    Specialty:  Obstetrics and Gynecology   Contact information:   [address] [place] [address] [phone]       Nigel Bridgeman 12/08/2013

## 2013-12-08 NOTE — Discharge Instructions (Signed)

## 2014-10-06 ENCOUNTER — Encounter (HOSPITAL_COMMUNITY): Payer: Self-pay | Admitting: *Deleted

## 2015-09-21 ENCOUNTER — Other Ambulatory Visit: Payer: Self-pay | Admitting: Obstetrics and Gynecology

## 2015-09-21 ENCOUNTER — Encounter (HOSPITAL_COMMUNITY): Payer: Self-pay | Admitting: *Deleted

## 2015-09-21 ENCOUNTER — Inpatient Hospital Stay (HOSPITAL_COMMUNITY)
Admission: AD | Admit: 2015-09-21 | Discharge: 2015-09-21 | Disposition: A | Discharge status: Home / Self Care | Payer: 59 | Source: Ambulatory Visit | Attending: Obstetrics & Gynecology | Admitting: Obstetrics & Gynecology

## 2015-09-21 DIAGNOSIS — Z87442 Personal history of urinary calculi: Secondary | ICD-10-CM | POA: Diagnosis not present

## 2015-09-21 DIAGNOSIS — K589 Irritable bowel syndrome without diarrhea: Secondary | ICD-10-CM | POA: Diagnosis not present

## 2015-09-21 DIAGNOSIS — O039 Complete or unspecified spontaneous abortion without complication: Secondary | ICD-10-CM | POA: Diagnosis not present

## 2015-09-21 DIAGNOSIS — O209 Hemorrhage in early pregnancy, unspecified: Secondary | ICD-10-CM | POA: Diagnosis present

## 2015-09-21 DIAGNOSIS — Z8669 Personal history of other diseases of the nervous system and sense organs: Secondary | ICD-10-CM

## 2015-09-21 LAB — URINE MICROSCOPIC-ADD ON

## 2015-09-21 LAB — CBC
HCT: 39.7 % (ref 36.0–46.0)
Hemoglobin: 13.2 g/dL (ref 12.0–15.0)
MCH: 29.3 pg (ref 26.0–34.0)
MCHC: 33.2 g/dL (ref 30.0–36.0)
MCV: 88 fL (ref 78.0–100.0)
PLATELETS: 266 10*3/uL (ref 150–400)
RBC: 4.51 MIL/uL (ref 3.87–5.11)
RDW: 12.3 % (ref 11.5–15.5)
WBC: 6.5 10*3/uL (ref 4.0–10.5)

## 2015-09-21 LAB — URINALYSIS, ROUTINE W REFLEX MICROSCOPIC
BILIRUBIN URINE: NEGATIVE
Glucose, UA: NEGATIVE mg/dL
Ketones, ur: NEGATIVE mg/dL
Nitrite: NEGATIVE
PROTEIN: NEGATIVE mg/dL
Specific Gravity, Urine: >1.03 — ABNORMAL HIGH (ref 1.005–1.030)
UROBILINOGEN UA: 0.2 mg/dL (ref 0.0–1.0)
pH: 6 (ref 5.0–8.0)

## 2015-09-21 LAB — HCG, QUANTITATIVE, PREGNANCY: hCG, Beta Chain, Quant, S: <1 m[IU]/mL (ref <5)

## 2015-09-21 NOTE — MAU Provider Note (Signed)
History   29 yo G2P1011 presents with +UPT on Friday, 10/14, and onset of moderate vaginal bleeding and cramping last night.  Hx of mildly irregular cycles since completing breastfeeding in January, but has had cycles at least every 1 1/2 months since then.  This pregnancy was unplanned/unexpected, but desired.  O+ type.  Hx SVB with [facility]/VL in 12/2013.  Patient Active Problem List   Diagnosis Date Noted  . History of kidney stones 09/21/2015  . IBS (irritable bowel syndrome) 09/21/2015  . Hx of migraines 09/21/2015  . Obstetric labial laceration, delivered, current hospitalization 12/08/2013  . Folic acid deficiency 12/06/2013  . Anxiety 12/06/2013  . Disorder of intestine 12/06/2013  . Ibuprofen adverse reaction 12/06/2013  . Vaginal delivery 12/06/2013    Chief Complaint  Patient presents with  . Abdominal Pain  . Vaginal Bleeding   HPI:  As above  OB History    Gravida Para Term Preterm AB TAB SAB Ectopic Multiple Living   Past Medical History  Diagnosis Date  . IBS (irritable bowel syndrome)     Past Surgical History  Procedure Laterality Date  . Colon surgery      2x  . Wisdom tooth extraction    . Colon biopsy      benign  . Removal of scar tissue  2012    intestinal blockage  . Intestinal reconstruction  68    Family History  Problem Relation Age of Onset  . Asthma Mother   . Hypertension Mother   . Hearing loss Mother     brain tumor  . Crohn's disease Father     Social History  Substance Use Topics  . Smoking status: Never Smoker   . Smokeless tobacco: Never Used  . Alcohol Use: Yes     Comment: occ, not with preg    Allergies:  Allergies  Allergen Reactions  . Ibuprofen Hives    Prescriptions prior to admission  Medication Sig Dispense Refill Last Dose  . acetaminophen (TYLENOL) 500 MG tablet Take 1,000 mg by mouth every 6 (six) hours as needed for mild pain.   Past Week at Unknown time  . FLUoxetine (PROZAC)  20 MG capsule Take 20 mg by mouth every morning.   09/20/2015 at Unknown time  . Prenatal Vit-Fe Fumarate-FA (PRENATAL MULTIVITAMIN) TABS Take 1 tablet by mouth daily at 12 noon.   09/20/2015 at Unknown time  . oxyCODONE-acetaminophen (PERCOCET/ROXICET) 5-325 MG per tablet Take 1-2 tablets by mouth every 4 (four) hours as needed for severe pain (moderate - severe pain). (Patient not taking: Reported on 09/21/2015) 36 tablet 0     ROS:  Vaginal bleeding, mild cramping Physical Exam   Blood pressure 122/69, pulse 87, temperature 98 F (36.7 C), temperature source Oral, resp. rate 18, height  (1.575 m), weight 71.578 kg (157 lb 12.8 oz), last menstrual period 08/03/2015, unknown if currently breastfeeding.    Physical Exam  Tearful, but in no physical distress Chest clear Heart RRR without murmur Abd soft, NT Pelvic--small amount dark blood in vault, cervix closed, NT Ext WNL  UPT negative  ED Course  Assessment: Hx +UPT, now negative  Plan: CBC, Seward Grater CNM, MSN 09/21/2015 10:33 AM  Addendum: Results for orders placed or performed during the hospital encounter of 09/21/15 (from the past 24 hour(s))  Urinalysis, Routine w reflex microscopic (not at [facility])  Status: Abnormal   Collection Time: 09/21/15  9:08 AM  Result Value Ref Range   Color, Urine YELLOW YELLOW   APPearance CLEAR CLEAR   Specific Gravity, Urine >1.030 (H) 1.005 - 1.030   pH 6.0 5.0 - 8.0   Glucose, UA NEGATIVE NEGATIVE mg/dL   Hgb urine dipstick LARGE (A) NEGATIVE   Bilirubin Urine NEGATIVE NEGATIVE   Ketones, ur NEGATIVE NEGATIVE mg/dL   Protein, ur NEGATIVE NEGATIVE mg/dL   Urobilinogen, UA 0.2 0.0 - 1.0 mg/dL   Nitrite NEGATIVE NEGATIVE   Leukocytes, UA TRACE (A) NEGATIVE  Urine microscopic-add on     Status: Abnormal   Collection Time: 09/21/15  9:08 AM  Result Value Ref Range   Squamous Epithelial / LPF FEW (A) RARE   WBC, UA 7-10 <3 WBC/hpf   RBC / HPF 7-10 <3 RBC/hpf    Bacteria, UA FEW (A) RARE  CBC     Status: None   Collection Time: 09/21/15  9:30 AM  Result Value Ref Range   WBC 6.5 4.0 - 10.5 K/uL   RBC 4.51 3.87 - 5.11 MIL/uL   Hemoglobin 13.2 12.0 - 15.0 g/dL   HCT 16.139.7 09.636.0 - 04.546.0 %   MCV 88.0 78.0 - 100.0 fL   MCH 29.3 26.0 - 34.0 pg   MCHC 33.2 30.0 - 36.0 g/dL   RDW 40.912.3 81.111.5 - 91.415.5 %   Platelets 266 150 - 400 K/uL  hCG, quantitative, pregnancy     Status: None   Collection Time: 09/21/15  9:30 AM  Result Value Ref Range   hCG, Beta Chain, Quant, S <1 <5 mIU/mL    Impression: Early SAB--negative QHCG  Plan: Home with bleeding precautions. Will continue to monitor bleeding--would anticipate resolution over the next week or so. F/u prn with [facility] with any concerns. Patient OK if pregnancy occurs, but plans to use precautions over the next few months. Support to patient for loss.  Nigel BridgemanVicki Mayleigh Tetrault, CNM 09/21/15 10:30a

## 2015-09-21 NOTE — Discharge Instructions (Signed)
Call if bleeding persists past 1 week, particularly if continues to be heavy. Take Tylenol as needed for pain. Call [facility] for any questions or concerns.

## 2015-09-21 NOTE — MAU Note (Signed)
Pos HPT on Friday, has had irregular periods.  Had light spotting yesterday, had more bleeding @ bedtime, also sharp lower abd pain.  Bleeding has continued through the night, also abd pain.  Was advised by MD office to come to MAU.

## 2015-09-21 NOTE — MAU Note (Signed)
Pregnancy Test completed result NEG. Rejected due to POS @ home.

## 2017-05-03 DIAGNOSIS — Z3040 Encounter for surveillance of contraceptives, unspecified: Secondary | ICD-10-CM | POA: Diagnosis not present

## 2017-05-03 DIAGNOSIS — Z124 Encounter for screening for malignant neoplasm of cervix: Secondary | ICD-10-CM | POA: Diagnosis not present

## 2017-05-03 DIAGNOSIS — Z01419 Encounter for gynecological examination (general) (routine) without abnormal findings: Secondary | ICD-10-CM | POA: Diagnosis not present

## 2017-05-03 DIAGNOSIS — Z6831 Body mass index (BMI) 31.0-31.9, adult: Secondary | ICD-10-CM | POA: Diagnosis not present

## 2017-05-10 DIAGNOSIS — E039 Hypothyroidism, unspecified: Secondary | ICD-10-CM | POA: Diagnosis not present

## 2018-05-31 DIAGNOSIS — G4700 Insomnia, unspecified: Secondary | ICD-10-CM | POA: Diagnosis not present

## 2018-05-31 DIAGNOSIS — L659 Nonscarring hair loss, unspecified: Secondary | ICD-10-CM | POA: Diagnosis not present

## 2018-05-31 DIAGNOSIS — R635 Abnormal weight gain: Secondary | ICD-10-CM | POA: Diagnosis not present

## 2018-05-31 DIAGNOSIS — R5383 Other fatigue: Secondary | ICD-10-CM | POA: Diagnosis not present

## 2019-04-12 DIAGNOSIS — S39012A Strain of muscle, fascia and tendon of lower back, initial encounter: Secondary | ICD-10-CM | POA: Diagnosis not present

## 2020-04-27 DIAGNOSIS — F410 Panic disorder [episodic paroxysmal anxiety] without agoraphobia: Secondary | ICD-10-CM | POA: Diagnosis not present

## 2020-04-27 DIAGNOSIS — N946 Dysmenorrhea, unspecified: Secondary | ICD-10-CM | POA: Diagnosis not present

## 2021-03-30 DIAGNOSIS — R5383 Other fatigue: Secondary | ICD-10-CM | POA: Diagnosis not present

## 2021-03-30 DIAGNOSIS — N946 Dysmenorrhea, unspecified: Secondary | ICD-10-CM | POA: Diagnosis not present

## 2021-03-30 DIAGNOSIS — E049 Nontoxic goiter, unspecified: Secondary | ICD-10-CM | POA: Diagnosis not present

## 2021-04-08 DIAGNOSIS — E049 Nontoxic goiter, unspecified: Secondary | ICD-10-CM | POA: Diagnosis not present

## 2021-04-13 DIAGNOSIS — E559 Vitamin D deficiency, unspecified: Secondary | ICD-10-CM | POA: Diagnosis not present

## 2021-04-13 DIAGNOSIS — Z0001 Encounter for general adult medical examination with abnormal findings: Secondary | ICD-10-CM | POA: Diagnosis not present

## 2021-04-13 DIAGNOSIS — R6882 Decreased libido: Secondary | ICD-10-CM | POA: Diagnosis not present

## 2021-04-13 DIAGNOSIS — E02 Subclinical iodine-deficiency hypothyroidism: Secondary | ICD-10-CM | POA: Diagnosis not present

## 2021-04-13 DIAGNOSIS — Z3041 Encounter for surveillance of contraceptive pills: Secondary | ICD-10-CM | POA: Diagnosis not present

## 2021-04-13 DIAGNOSIS — Z13220 Encounter for screening for lipoid disorders: Secondary | ICD-10-CM | POA: Diagnosis not present

## 2021-04-13 DIAGNOSIS — F4323 Adjustment disorder with mixed anxiety and depressed mood: Secondary | ICD-10-CM | POA: Diagnosis not present

## 2021-04-13 DIAGNOSIS — E038 Other specified hypothyroidism: Secondary | ICD-10-CM | POA: Diagnosis not present

## 2021-05-11 DIAGNOSIS — E611 Iron deficiency: Secondary | ICD-10-CM | POA: Diagnosis not present

## 2021-05-11 DIAGNOSIS — F32A Depression, unspecified: Secondary | ICD-10-CM | POA: Diagnosis not present

## 2021-07-12 DIAGNOSIS — E611 Iron deficiency: Secondary | ICD-10-CM | POA: Diagnosis not present

## 2021-10-08 DIAGNOSIS — J0100 Acute maxillary sinusitis, unspecified: Secondary | ICD-10-CM | POA: Diagnosis not present

## 2021-11-22 DIAGNOSIS — Z3041 Encounter for surveillance of contraceptive pills: Secondary | ICD-10-CM | POA: Diagnosis not present

## 2021-11-22 DIAGNOSIS — F32A Depression, unspecified: Secondary | ICD-10-CM | POA: Diagnosis not present

## 2021-11-22 DIAGNOSIS — Z79899 Other long term (current) drug therapy: Secondary | ICD-10-CM | POA: Diagnosis not present

## 2022-01-31 DIAGNOSIS — Z79899 Other long term (current) drug therapy: Secondary | ICD-10-CM | POA: Diagnosis not present

## 2022-01-31 DIAGNOSIS — L700 Acne vulgaris: Secondary | ICD-10-CM | POA: Diagnosis not present

## 2022-01-31 DIAGNOSIS — L719 Rosacea, unspecified: Secondary | ICD-10-CM | POA: Diagnosis not present

## 2022-01-31 DIAGNOSIS — Z23 Encounter for immunization: Secondary | ICD-10-CM | POA: Diagnosis not present
